# Patient Record
Sex: Female | Born: 1990 | Race: Black or African American | Hispanic: No | Marital: Single | Smoking: Never smoker
Health system: Southern US, Community
[De-identification: ages and names within clinical notes are randomized; demographics above are authoritative.]

## PROBLEM LIST (undated history)

## (undated) ENCOUNTER — Inpatient Hospital Stay (HOSPITAL_COMMUNITY): Payer: Self-pay

## (undated) ENCOUNTER — Inpatient Hospital Stay (HOSPITAL_COMMUNITY): Payer: Medicaid Other

## (undated) DIAGNOSIS — Z789 Other specified health status: Secondary | ICD-10-CM

## (undated) DIAGNOSIS — K5900 Constipation, unspecified: Secondary | ICD-10-CM

## (undated) HISTORY — DX: Constipation, unspecified: K59.00

## (undated) HISTORY — PX: WISDOM TOOTH EXTRACTION: SHX21

---

## 2012-03-24 ENCOUNTER — Ambulatory Visit
Admission: RE | Admit: 2012-03-24 | Discharge: 2012-03-24 | Disposition: A | Discharge status: Home / Self Care | Source: Ambulatory Visit | Attending: Family Medicine | Admitting: Family Medicine

## 2012-03-24 ENCOUNTER — Other Ambulatory Visit: Payer: Self-pay | Admitting: Family Medicine

## 2012-03-24 DIAGNOSIS — R52 Pain, unspecified: Secondary | ICD-10-CM

## 2012-05-25 ENCOUNTER — Other Ambulatory Visit (HOSPITAL_COMMUNITY)
Admission: RE | Admit: 2012-05-25 | Discharge: 2012-05-25 | Disposition: A | Discharge status: Home / Self Care | Source: Ambulatory Visit | Attending: Family Medicine | Admitting: Family Medicine

## 2012-05-25 DIAGNOSIS — Z01419 Encounter for gynecological examination (general) (routine) without abnormal findings: Secondary | ICD-10-CM | POA: Insufficient documentation

## 2013-07-25 ENCOUNTER — Inpatient Hospital Stay (HOSPITAL_COMMUNITY)
Admission: AD | Admit: 2013-07-25 | Discharge: 2013-07-25 | Disposition: A | Discharge status: Home / Self Care | Payer: Medicaid Other | Source: Ambulatory Visit | Attending: Obstetrics & Gynecology | Admitting: Obstetrics & Gynecology

## 2013-07-25 ENCOUNTER — Inpatient Hospital Stay (HOSPITAL_COMMUNITY): Payer: Medicaid Other

## 2013-07-25 ENCOUNTER — Encounter (HOSPITAL_COMMUNITY): Payer: Self-pay | Admitting: *Deleted

## 2013-07-25 DIAGNOSIS — O99891 Other specified diseases and conditions complicating pregnancy: Secondary | ICD-10-CM | POA: Insufficient documentation

## 2013-07-25 DIAGNOSIS — O26899 Other specified pregnancy related conditions, unspecified trimester: Secondary | ICD-10-CM

## 2013-07-25 DIAGNOSIS — Z3490 Encounter for supervision of normal pregnancy, unspecified, unspecified trimester: Secondary | ICD-10-CM

## 2013-07-25 DIAGNOSIS — R109 Unspecified abdominal pain: Secondary | ICD-10-CM | POA: Insufficient documentation

## 2013-07-25 HISTORY — DX: Other specified health status: Z78.9

## 2013-07-25 LAB — WET PREP, GENITAL: Yeast Wet Prep HPF POC: NONE SEEN

## 2013-07-25 LAB — URINE MICROSCOPIC-ADD ON

## 2013-07-25 LAB — CBC
HCT: 37.4 % (ref 36.0–46.0)
Hemoglobin: 13 g/dL (ref 12.0–15.0)
MCHC: 34.8 g/dL (ref 30.0–36.0)
RBC: 4.26 MIL/uL (ref 3.87–5.11)
WBC: 9.2 10*3/uL (ref 4.0–10.5)

## 2013-07-25 LAB — URINALYSIS, ROUTINE W REFLEX MICROSCOPIC
Glucose, UA: NEGATIVE mg/dL
Ketones, ur: NEGATIVE mg/dL
Protein, ur: NEGATIVE mg/dL
Urobilinogen, UA: 0.2 mg/dL (ref 0.0–1.0)

## 2013-07-25 LAB — HCG, QUANTITATIVE, PREGNANCY: hCG, Beta Chain, Quant, S: 21140 m[IU]/mL — ABNORMAL HIGH (ref <5)

## 2013-07-25 LAB — ABO/RH: ABO/RH(D): AB POS

## 2013-07-25 MED ORDER — CONCEPT OB 130-92.4-1 MG PO CAPS: 1.0000 | ORAL_CAPSULE | Freq: Every day | ORAL | Status: DC

## 2013-07-25 NOTE — MAU Provider Note (Signed)
Chief Complaint: Possible Pregnancy and Abdominal Cramping   First Provider Initiated Contact with Patient 07/25/13 2011     SUBJECTIVE HPI: Christine Oliver is a 22 y.o. G1P0 at [redacted]w[redacted]d by LMP who presents with gradually worsening low abd cramping over the past few days. Pos UPT. No Other testing this pregnancy. Denies fever, chills, passage of tissue, vaginal bleeding, vaginal discharge, urinary complaints or GI complaints.  Past Medical History  Diagnosis Date  . Medical history non-contributory    OB History  Gravida Para Term Preterm AB SAB TAB Ectopic Multiple Living  1         0    # Outcome Date GA Lbr Len/2nd Weight Sex Delivery Anes PTL Lv  1 CUR              Past Surgical History  Procedure Laterality Date  . Wisdom tooth extraction     History   Social History  . Marital Status: Single    Spouse Name: N/A    Number of Children: N/A  . Years of Education: N/A   Occupational History  . Not on file.   Social History Main Topics  . Smoking status: Never Smoker   . Smokeless tobacco: Not on file  . Alcohol Use: Yes     Comment: socially  . Drug Use: No  . Sexual Activity: Yes    Birth Control/ Protection: None   Other Topics Concern  . Not on file   Social History Narrative  . No narrative on file   No current facility-administered medications on file prior to encounter.   No current outpatient prescriptions on file prior to encounter.   No Known Allergies  ROS: Pertinent items in HPI  OBJECTIVE Blood pressure 134/87, pulse 93, temperature 99.2 F (37.3 C), temperature source Oral, resp. rate 18, height 5\' 3"  (1.6 m), weight 57.153 kg (126 lb), last menstrual period 06/14/2013, SpO2 100.00%. GENERAL: Well-developed, well-nourished female in no acute distress.  HEENT: Normocephalic HEART: normal rate RESP: normal effort ABDOMEN: Soft, Mild SP tenderness. No CVAT. Pos BS. EXTREMITIES: Nontender, no edema NEURO: Alert and oriented SPECULUM EXAM: NEFG,  physiologic discharge, no blood noted, cervix clean BIMANUAL: cervix closed; uterus ? slightly enlarged, no adnexal tenderness or masses. No CMT.  LAB RESULTS Results for orders placed during the hospital encounter of 07/25/13 (from the past 24 hour(s))  URINALYSIS, ROUTINE W REFLEX MICROSCOPIC     Status: Abnormal   Collection Time    07/25/13  7:14 PM      Result Value Range   Color, Urine YELLOW  YELLOW   APPearance CLEAR  CLEAR   Specific Gravity, Urine 1.020  1.005 - 1.030   pH 7.0  5.0 - 8.0   Glucose, UA NEGATIVE  NEGATIVE mg/dL   Hgb urine dipstick TRACE (*) NEGATIVE   Bilirubin Urine NEGATIVE  NEGATIVE   Ketones, ur NEGATIVE  NEGATIVE mg/dL   Protein, ur NEGATIVE  NEGATIVE mg/dL   Urobilinogen, UA 0.2  0.0 - 1.0 mg/dL   Nitrite NEGATIVE  NEGATIVE   Leukocytes, UA SMALL (*) NEGATIVE  URINE MICROSCOPIC-ADD ON     Status: Abnormal   Collection Time    07/25/13  7:14 PM      Result Value Range   Squamous Epithelial / LPF FEW (*) RARE   WBC, UA 3-6  <3 WBC/hpf   RBC / HPF 0-2  <3 RBC/hpf   Bacteria, UA FEW (*) RARE  POCT PREGNANCY, URINE  Status: Abnormal   Collection Time    07/25/13  7:49 PM      Result Value Range   Preg Test, Ur POSITIVE (*) NEGATIVE  HCG, QUANTITATIVE, PREGNANCY     Status: Abnormal   Collection Time    07/25/13  8:40 PM      Result Value Range   hCG, Beta Chain, Sharene Butters, Kathie Rhodes [id number] (*) <5 mIU/mL  ABO/RH     Status: None   Collection Time    07/25/13  8:40 PM      Result Value Range   ABO/RH(D) AB POS    CBC     Status: None   Collection Time    07/25/13  8:40 PM      Result Value Range   WBC 9.2  4.0 - 10.5 K/uL   RBC 4.26  3.87 - 5.11 MIL/uL   Hemoglobin 13.0  12.0 - 15.0 g/dL   HCT 78.2  95.6 - 21.3 %   MCV 87.8  78.0 - 100.0 fL   MCH 30.5  26.0 - 34.0 pg   MCHC 34.8  30.0 - 36.0 g/dL   RDW 08.6  57.8 - 46.9 %   Platelets 274  150 - 400 K/uL    IMAGING US Ob Comp Less 14 Wks  07/25/2013   CLINICAL DATA:  Abdominal cramping.  Positive home pregnancy test.  EXAM: OBSTETRIC <14 WK ULTRASOUND  TECHNIQUE: Transabdominal ultrasound was performed for evaluation of the gestation as well as the maternal uterus and adnexal regions.  COMPARISON:  None.  FINDINGS: Intrauterine gestational sac: Visualized/normal in shape.  Yolk sac:  Present  Embryo:  Present  Cardiac Activity: Present  Heart Rate: 115 bpm  MSD: 3.8 mm    6 w   1  d  [place] EDC: 03/19/2014  Maternal uterus/adnexae: Small subchorionic hemorrhage. Normal ovaries. No free fluid.  IMPRESSION: Single viable intrauterine 6 weeks 1 day embryo as described above. [facility] 03/19/2014.   Electronically Signed   By: Davonna Belling M.D.   On: 07/25/2013 21:49   MAU COURSE  ASSESSMENT 1. Abdominal pain complicating pregnancy   2. Intrauterine pregnancy     PLAN Discharge home in stable condition.  GC/CT pending.     Follow-up Information   Follow up with [facility].      Follow up with THE [facility] [place] MATERNITY ADMISSIONS. (As needed in emergencies)    Contact information:   [id number] [place] [address] [phone]       Medication List         CONCEPT OB 130-92.4-1 MG Caps  Take 1 tablet by mouth daily.       [place], CNM 07/25/2013  10:52 PM

## 2013-07-25 NOTE — MAU Note (Signed)
Pt reports she had a positive home pregnancy test, LMP 06/14/2013. Lower abd cramping

## 2013-07-26 LAB — URINE CULTURE

## 2013-07-26 LAB — GC/CHLAMYDIA PROBE AMP
CT Probe RNA: NEGATIVE
GC Probe RNA: NEGATIVE

## 2013-08-29 ENCOUNTER — Encounter (HOSPITAL_COMMUNITY): Payer: Self-pay | Admitting: *Deleted

## 2013-08-29 ENCOUNTER — Inpatient Hospital Stay (HOSPITAL_COMMUNITY)
Admission: AD | Admit: 2013-08-29 | Discharge: 2013-08-29 | Disposition: A | Discharge status: Home / Self Care | Payer: Medicaid Other | Source: Ambulatory Visit | Attending: Obstetrics & Gynecology | Admitting: Obstetrics & Gynecology

## 2013-08-29 ENCOUNTER — Inpatient Hospital Stay (HOSPITAL_COMMUNITY): Payer: Medicaid Other

## 2013-08-29 DIAGNOSIS — K5900 Constipation, unspecified: Secondary | ICD-10-CM | POA: Insufficient documentation

## 2013-08-29 DIAGNOSIS — O021 Missed abortion: Secondary | ICD-10-CM

## 2013-08-29 DIAGNOSIS — R109 Unspecified abdominal pain: Secondary | ICD-10-CM | POA: Insufficient documentation

## 2013-08-29 LAB — CBC
HCT: 37.4 % (ref 36.0–46.0)
Hemoglobin: 12.6 g/dL (ref 12.0–15.0)
MCH: 29.7 pg (ref 26.0–34.0)
MCHC: 33.7 g/dL (ref 30.0–36.0)
MCV: 88.2 fL (ref 78.0–100.0)
Platelets: 274 10*3/uL (ref 150–400)
RBC: 4.24 MIL/uL (ref 3.87–5.11)
WBC: 7.6 10*3/uL (ref 4.0–10.5)

## 2013-08-29 LAB — URINALYSIS, ROUTINE W REFLEX MICROSCOPIC
Bilirubin Urine: NEGATIVE
Glucose, UA: NEGATIVE mg/dL
Hgb urine dipstick: NEGATIVE
Ketones, ur: NEGATIVE mg/dL
Nitrite: NEGATIVE
Specific Gravity, Urine: 1.02 (ref 1.005–1.030)
Urobilinogen, UA: 0.2 mg/dL (ref 0.0–1.0)

## 2013-08-29 MED ORDER — MAGNESIUM CITRATE PO SOLN
0.5000 | Freq: Once | ORAL | Status: AC
  Administered 2013-08-29: 0.5 via ORAL
  Filled 2013-08-29: qty 296

## 2013-08-29 MED ORDER — MISOPROSTOL 200 MCG PO TABS: 800.0000 ug | ORAL_TABLET | Freq: Four times a day (QID) | ORAL | Status: DC

## 2013-08-29 MED ORDER — OXYCODONE-ACETAMINOPHEN 5-325 MG PO TABS: 1.0000 | ORAL_TABLET | ORAL | Status: DC | PRN

## 2013-08-29 MED ORDER — PROMETHAZINE HCL 25 MG PO TABS: 25.0000 mg | ORAL_TABLET | Freq: Four times a day (QID) | ORAL | Status: DC | PRN

## 2013-08-29 NOTE — MAU Provider Note (Signed)
History     CSN: [id number]  Arrival date and time: 08/29/13 1807   None     Chief Complaint  Patient presents with  . Constipation  . Vaginal Discharge  . Morning Sickness  . Abdominal Pain   HPI This is a 22 y.o. female at [redacted]w[redacted]d who presents with c/o spotting and cramping.  Also has been constipated. This is a lifelong problem for her,but her usual remedies are not working. Had a viable IUP in October.   RN Note:  Patient states she has continued to have cramping since she was seen in MAU in October. Has not had a good bowel movement in about one month. Has tried OTC medication and prune juice with little to no results. States she has nausea but has gotten better the past week. Has a little spotting on and off, not enough to wear a pad. Back pain has been for about 2 weeks.        OB History   Grav Para Term Preterm Abortions TAB SAB Ect Mult Living   1         0      Past Medical History  Diagnosis Date  . Medical history non-contributory     Past Surgical History  Procedure Laterality Date  . Wisdom tooth extraction      History reviewed. No pertinent family history.  History  Substance Use Topics  . Smoking status: Never Smoker   . Smokeless tobacco: Not on file  . Alcohol Use: Yes     Comment: socially    Allergies: No Known Allergies  Prescriptions prior to admission  Medication Sig Dispense Refill  . Prenat w/o A Vit-FeFum-FePo-FA (CONCEPT OB) 130-92.4-1 MG CAPS Take 1 tablet by mouth daily.  30 capsule  12    Review of Systems  Constitutional: Negative for fever, chills and malaise/fatigue.  Gastrointestinal: Positive for abdominal pain and constipation. Negative for nausea, vomiting and diarrhea.       Vaginal bleeding  Genitourinary: Negative for dysuria.  Neurological: Negative for dizziness, weakness and headaches.   Physical Exam   Blood pressure 130/82, pulse 93, temperature 99.7 F (37.6 C), temperature source Oral, resp. rate 16,  height 5\' 3"  (1.6 m), weight 57.97 kg (127 lb 12.8 oz), last menstrual period 06/14/2013, SpO2 100.00%.  Physical Exam  Constitutional: She is oriented to person, place, and time. She appears well-developed and well-nourished. No distress.  Cardiovascular: Normal rate.   Respiratory: Effort normal.  GI: Soft. She exhibits no distension and no mass. There is no tenderness. There is no rebound and no guarding.  Genitourinary: Uterus normal. Vaginal discharge (small amount of bleeding) found.  Musculoskeletal: Normal range of motion.  Neurological: She is alert and oriented to person, place, and time.  Skin: Skin is warm and dry.  Psychiatric: She has a normal mood and affect.   Results for orders placed during the hospital encounter of 08/29/13 (from the past 24 hour(s))  URINALYSIS, ROUTINE W REFLEX MICROSCOPIC     Status: Abnormal   Collection Time    08/29/13  6:39 PM      Result Value Range   Color, Urine YELLOW  YELLOW   APPearance CLOUDY (*) CLEAR   Specific Gravity, Urine 1.020  1.005 - 1.030   pH 8.0  5.0 - 8.0   Glucose, UA NEGATIVE  NEGATIVE mg/dL   Hgb urine dipstick NEGATIVE  NEGATIVE   Bilirubin Urine NEGATIVE  NEGATIVE   Ketones, ur NEGATIVE  NEGATIVE  mg/dL   Protein, ur NEGATIVE  NEGATIVE mg/dL   Urobilinogen, UA 0.2  0.0 - 1.0 mg/dL   Nitrite NEGATIVE  NEGATIVE   Leukocytes, UA NEGATIVE  NEGATIVE  CBC     Status: None   Collection Time    08/29/13  8:44 PM      Result Value Range   WBC 7.6  4.0 - 10.5 K/uL   RBC 4.24  3.87 - 5.11 MIL/uL   Hemoglobin 12.6  12.0 - 15.0 g/dL   HCT 16.1  09.6 - 04.5 %   MCV 88.2  78.0 - 100.0 fL   MCH 29.7  26.0 - 34.0 pg   MCHC 33.7  30.0 - 36.0 g/dL   RDW 40.9  81.1 - 91.4 %   Platelets 274  150 - 400 K/uL    MAU Course  Procedures  MDM US Ob Transvaginal  08/29/2013   CLINICAL DATA:  Positive pregnancy test and vaginal bleeding.  EXAM: TRANSVAGINAL OB ULTRASOUND  TECHNIQUE: Transvaginal ultrasound was performed for  complete evaluation of the gestation as well as the maternal uterus, adnexal regions, and pelvic cul-de-sac.  COMPARISON:  07/25/2013  FINDINGS: Intrauterine gestational sac: Single, irregular gestational sac is identified.  Yolk sac:  Not visualized  Embryo:  Visualize  Cardiac Activity: Not visualized  CRL:   19.5  mm   8 w 4 d  The uterus appears thickened and heterogeneous. There may be a trace amount of subchorionic hemorrhage. Neither maternal ovary can be visualized. No free fluid is identified in the cul-de-sac  IMPRESSION: Single intrauterine gestational sac with embryo visualized. There is no detectable cardiac activity within the embryo on 2D grayscale or M-mode Doppler evaluation. Crown-rump length suggests lack of appropriate interval progression since previous ultrasound exam. Together, these imaging features are consistent with intrauterine gestational demise.  I discussed these findings by telephone with the provider in the MAU Mayford Knife) at 2024 hr on 08/29/2013.   Electronically Signed   By: Kennith Center M.D.   On: 08/29/2013 20:25    Assessment and Plan  A:  Missed abortion      Constipation  P:  Discussed with patient and partner, appropriately grieving      Options reviewed, chose Cytotec      Rx Cytotec, Phenergan and Percocet      Magnesium citrate      Followup in clinic  [facility] 08/29/2013, 7:01 PM

## 2013-08-29 NOTE — MAU Note (Signed)
Patient states she has continued to have cramping since she was seen in MAU in October. Has not had a good bowel movement in about one month. Has tried OTC medication and prune juice with little to no results. States she has nausea but has gotten better the past week. Has a little spotting on and off, not enough to wear a pad. Back pain has been for about 2 weeks.

## 2013-10-01 ENCOUNTER — Encounter: Payer: Self-pay | Admitting: Family Medicine

## 2013-10-01 ENCOUNTER — Ambulatory Visit (INDEPENDENT_AMBULATORY_CARE_PROVIDER_SITE_OTHER): Admitting: Family Medicine

## 2013-10-01 VITALS — BP 134/86 | HR 82 | Ht 63.0 in | Wt 129.4 lb

## 2013-10-01 DIAGNOSIS — Z3049 Encounter for surveillance of other contraceptives: Secondary | ICD-10-CM

## 2013-10-01 DIAGNOSIS — Z30430 Encounter for insertion of intrauterine contraceptive device: Secondary | ICD-10-CM

## 2013-10-01 DIAGNOSIS — O039 Complete or unspecified spontaneous abortion without complication: Secondary | ICD-10-CM

## 2013-10-01 MED ORDER — LEVONORGESTREL 20 MCG/24HR IU IUD
INTRAUTERINE_SYSTEM | Freq: Once | INTRAUTERINE | Status: AC
  Administered 2013-10-01: 17:00:00 1 via INTRAUTERINE

## 2013-10-01 NOTE — Patient Instructions (Addendum)
Levonorgestrel intrauterine device (IUD) What is this medicine? LEVONORGESTREL IUD ([name] voe nor jes trel) is a contraceptive (birth control) device. The device is placed inside the uterus by a healthcare professional. It is used to prevent pregnancy and can also be used to treat heavy bleeding that occurs during your period. Depending on the device, it can be used for 3 to 5 years. This medicine may be used for other purposes; ask your health care provider or pharmacist if you have questions. COMMON BRAND NAME(S): Mirena, Skyla What should I tell my health care provider before I take this medicine? They need to know if you have any of these conditions: -abnormal Pap smear -cancer of the breast, uterus, or cervix -diabetes -endometritis -genital or pelvic infection now or in the past -have more than one sexual partner or your partner has more than one partner -heart disease -history of an ectopic or tubal pregnancy -immune system problems -IUD in place -liver disease or tumor -problems with blood clots or take blood-thinners -use intravenous drugs -uterus of unusual shape -vaginal bleeding that has not been explained -an unusual or allergic reaction to levonorgestrel, other hormones, silicone, or polyethylene, medicines, foods, dyes, or preservatives -pregnant or trying to get pregnant -breast-feeding How should I use this medicine? This device is placed inside the uterus by a health care professional. Talk to your pediatrician regarding the use of this medicine in children. Special care may be needed. Overdosage: If you think you have taken too much of this medicine contact a poison control center or emergency room at once. NOTE: This medicine is only for you. Do not share this medicine with others. What if I miss a dose? This does not apply. What may interact with this medicine? Do not take this medicine with any of the following  medications: -amprenavir -bosentan -fosamprenavir This medicine may also interact with the following medications: -aprepitant -barbiturate medicines for inducing sleep or treating seizures -bexarotene -griseofulvin -medicines to treat seizures like carbamazepine, ethotoin, felbamate, oxcarbazepine, phenytoin, topiramate -modafinil -pioglitazone -rifabutin -rifampin -rifapentine -some medicines to treat HIV infection like atazanavir, indinavir, lopinavir, nelfinavir, tipranavir, ritonavir -St. John's wort -warfarin This list may not describe all possible interactions. Give your health care provider a list of all the medicines, herbs, non-prescription drugs, or dietary supplements you use. Also tell them if you smoke, drink alcohol, or use illegal drugs. Some items may interact with your medicine. What should I watch for while using this medicine? Visit your doctor or health care professional for regular check ups. See your doctor if you or your partner has sexual contact with others, becomes HIV positive, or gets a sexual transmitted disease. This product does not protect you against HIV infection (AIDS) or other sexually transmitted diseases. You can check the placement of the IUD yourself by reaching up to the top of your vagina with clean fingers to feel the threads. Do not pull on the threads. It is a good habit to check placement after each menstrual period. Call your doctor right away if you feel more of the IUD than just the threads or if you cannot feel the threads at all. The IUD may come out by itself. You may become pregnant if the device comes out. If you notice that the IUD has come out use a backup birth control method like condoms and call your health care provider. Using tampons will not change the position of the IUD and are okay to use during your period. What side effects may I   notice from receiving this medicine? Side effects that you should report to your doctor or  health care professional as soon as possible: -allergic reactions like skin rash, itching or hives, swelling of the face, lips, or tongue -fever, flu-like symptoms -genital sores -high blood pressure -no menstrual period for 6 weeks during use -pain, swelling, warmth in the leg -pelvic pain or tenderness -severe or sudden headache -signs of pregnancy -stomach cramping -sudden shortness of breath -trouble with balance, talking, or walking -unusual vaginal bleeding, discharge -yellowing of the eyes or skin Side effects that usually do not require medical attention (report to your doctor or health care professional if they continue or are bothersome): -acne -breast pain -change in sex drive or performance -changes in weight -cramping, dizziness, or faintness while the device is being inserted -headache -irregular menstrual bleeding within first 3 to 6 months of use -nausea This list may not describe all possible side effects. Call your doctor for medical advice about side effects. You may report side effects to FDA at 1-800-FDA-1088. Where should I keep my medicine? This does not apply. NOTE: This sheet is a summary. It may not cover all possible information. If you have questions about this medicine, talk to your doctor, pharmacist, or health care provider.  2014, Elsevier/Gold Standard. (2011-10-28 13:54:04)  

## 2013-10-01 NOTE — Progress Notes (Signed)
   Subjective:    Patient ID: Christine Oliver, female    DOB: 04/25/1991, 22 y.o.   MRN: [id number]  HPI  New pt. For ED f/u after SAB treated with Cytotec. Bled x 2 wks. Recent cycle starting today. Works as a Museum/gallery conservator.  Does not desire more pregnancy at this time.  Previously on NuvaRing, wants more long-term contraception.  Failed Depo with bleeding.  Past Medical History  Diagnosis Date  . Medical history non-contributory    Past Surgical History  Procedure Laterality Date  . Wisdom tooth extraction     No Known Allergies  No current outpatient prescriptions on file prior to visit.   No current facility-administered medications on file prior to visit.   History reviewed. No pertinent family history. History   Social History  . Marital Status: Single    Spouse Name: N/A    Number of Children: N/A  . Years of Education: N/A   Occupational History  . Not on file.   Social History Main Topics  . Smoking status: Never Smoker   . Smokeless tobacco: Not on file  . Alcohol Use: Yes     Comment: socially  . Drug Use: No  . Sexual Activity: Yes    Birth Control/ Protection: None   Other Topics Concern  . Not on file   Social History Narrative  . No narrative on file    Review of Systems  Constitutional: Negative for fever and fatigue.  Respiratory: Negative for shortness of breath.   Cardiovascular: Negative for leg swelling.  Gastrointestinal: Negative for abdominal pain.  Genitourinary: Negative for dysuria and urgency.       Objective:   Physical Exam  Vitals reviewed. Constitutional: She appears well-developed and well-nourished. No distress.  HENT:  Head: Normocephalic and atraumatic.  Eyes: No scleral icterus.  Neck: Neck supple.  Cardiovascular: Normal rate.   Pulmonary/Chest: Effort normal.  Abdominal: Soft. There is no tenderness.  Genitourinary: Vagina normal and uterus normal.  Musculoskeletal: Normal range of motion.  Neurological: She is  alert.  Skin: Skin is warm and dry.    Procedure: Patient identified, informed consent performed, signed copy in chart, time out was performed.  Urine pregnancy test negative.  Speculum placed in the vagina.  Cervix visualized.  Cleaned with Betadine x 2.  Grasped anteriourly with a single tooth tenaculum.  Uterus sounded to 7 cm.  Mirena IUD placed per manufacturer's recommendations.  Strings trimmed to 3 cm.   Patient given post procedure instructions and Mirena care card with expiration date.        Assessment & Plan:  Surveillance of other previously prescribed contraceptive method - Plan: levonorgestrel (MIRENA) 20 MCG/24HR IUD  Encounter for IUD insertion  S/p IUD insertion. F/u SAB-complete--no issues.

## 2013-10-11 NOTE — L&D Delivery Note (Addendum)
Patient is 23 y.o. [id number] [id number] admitted for IOL 2/2 SGA with elevated dopplers, induction with cytotec x 2 and FB, GBS+   Delivery Note At 8:54 PM a viable female was delivered via Vaginal, Spontaneous Delivery (Presentation: ; Occiput Anterior).  APGAR: 8, 9; weight 4 lb 9.4 oz (2081 g).   Placenta status: Intact, Spontaneous.  Cord: 3 vessels with the following complications: None.  Anesthesia: Epidural  Episiotomy: None Lacerations: 1st degree Suture Repair: 3.0 vicryl Est. Blood Loss (mL): 200  Mom to postpartum.  Baby to Couplet care / Skin to Skin.  Christine Oliver 09/27/2014, 9:54 PM

## 2013-10-15 ENCOUNTER — Encounter: Admitting: Obstetrics & Gynecology

## 2013-10-17 ENCOUNTER — Encounter: Payer: Self-pay | Admitting: *Deleted

## 2014-06-15 ENCOUNTER — Inpatient Hospital Stay (HOSPITAL_COMMUNITY)
Admission: AD | Admit: 2014-06-15 | Discharge: 2014-06-15 | Disposition: A | Discharge status: Home / Self Care | Payer: Medicaid Other | Source: Ambulatory Visit | Attending: Family Medicine | Admitting: Family Medicine

## 2014-06-15 ENCOUNTER — Encounter (HOSPITAL_COMMUNITY): Payer: Self-pay

## 2014-06-15 ENCOUNTER — Inpatient Hospital Stay (HOSPITAL_COMMUNITY): Payer: Medicaid Other

## 2014-06-15 DIAGNOSIS — O0930 Supervision of pregnancy with insufficient antenatal care, unspecified trimester: Secondary | ICD-10-CM | POA: Diagnosis not present

## 2014-06-15 DIAGNOSIS — O99891 Other specified diseases and conditions complicating pregnancy: Secondary | ICD-10-CM | POA: Insufficient documentation

## 2014-06-15 DIAGNOSIS — Z3490 Encounter for supervision of normal pregnancy, unspecified, unspecified trimester: Secondary | ICD-10-CM

## 2014-06-15 DIAGNOSIS — Z3200 Encounter for pregnancy test, result unknown: Secondary | ICD-10-CM | POA: Diagnosis present

## 2014-06-15 DIAGNOSIS — Z331 Pregnant state, incidental: Secondary | ICD-10-CM

## 2014-06-15 DIAGNOSIS — Z3689 Encounter for other specified antenatal screening: Secondary | ICD-10-CM

## 2014-06-15 DIAGNOSIS — O0932 Supervision of pregnancy with insufficient antenatal care, second trimester: Secondary | ICD-10-CM

## 2014-06-15 DIAGNOSIS — O9989 Other specified diseases and conditions complicating pregnancy, childbirth and the puerperium: Principal | ICD-10-CM

## 2014-06-15 LAB — POCT PREGNANCY, URINE: PREG TEST UR: POSITIVE — AB

## 2014-06-15 NOTE — MAU Provider Note (Signed)
Attestation of Attending Supervision of Advanced Practitioner (PA/CNM/NP): Evaluation and management procedures were performed by the Advanced Practitioner under my supervision and collaboration.  I have reviewed the Advanced Practitioner's note and chart, and I agree with the management and plan. Pt. Had U/s at 5 wks, which did not visualize an IUD at that time.  Reva Bores, MD [facility] Attending 06/15/2014 8:58 PM

## 2014-06-15 NOTE — MAU Provider Note (Signed)
  History     CSN: [id number]  Arrival date and time: 06/15/14 1643   First Provider Initiated Contact with Patient 06/15/14 1722      Chief Complaint  Patient presents with  . Positive pregnancy test, has IUD    HPI Ms. Christine Oliver is a 23 y.o. G1P0010 at Unknown who presents to MAU today with complaint of +HPT and Mirena. The patient states that she had Mirena placed in December. She has not had a period or any bleeding since then. She took 2 HPT and both were positive. She denies abdominal pain, vaginal bleeding, discharge or fever. She states occasional nausea without vomiting.   OB History   Grav Para Term Preterm Abortions TAB SAB Ect Mult Living   0      Past Medical History  Diagnosis Date  . Medical history non-contributory     Past Surgical History  Procedure Laterality Date  . Wisdom tooth extraction      History reviewed. No pertinent family history.  History  Substance Use Topics  . Smoking status: Never Smoker   . Smokeless tobacco: Not on file  . Alcohol Use: Yes     Comment: socially    Allergies: No Known Allergies  No prescriptions prior to admission    Review of Systems  Constitutional: Negative for fever and malaise/fatigue.  Gastrointestinal: Positive for nausea. Negative for vomiting and abdominal pain.  Genitourinary:       Neg - vaginal bleeding   Physical Exam   Blood pressure 146/80, pulse 112, temperature 99.1 F (37.3 C), temperature source Oral, resp. rate 18, height  (1.6 m), last menstrual period 06/14/2013, not currently breastfeeding.  Physical Exam  Constitutional: She is oriented to person, place, and time. She appears well-developed and well-nourished. No distress.  HENT:  Head: Normocephalic.  Cardiovascular: Normal rate.   Respiratory: Effort normal.  GI: Soft. She exhibits no distension and no mass. There is no tenderness. There is no rebound and no guarding.  Fundal height palpable just below  the umbilicus  Genitourinary: Uterus is enlarged. Cervix exhibits no discharge and no friability. No bleeding around the vagina. No vaginal discharge found.    Neurological: She is alert and oriented to person, place, and time.  Skin: Skin is warm and dry. No erythema.  Psychiatric: She has a normal mood and affect.   Results for orders placed during the hospital encounter of 06/15/14 (from the past 24 hour(s))  POCT PREGNANCY, URINE     Status: Abnormal   Collection Time    06/15/14  5:52 PM      Result Value Ref Range   Preg Test, Ur POSITIVE (*) NEGATIVE    MAU Course  Procedures None  MDM FHR - 166 bpm with doppler Unable to visualize IUD strings. [place] ordered to confirm placement and dating.  Preliminary [place] reports showed IUP at [redacted]w[redacted]d with normal cardiac activity. No IUD visualized.  Assessment and Plan  A: SIUP at [redacted]w[redacted]d No prenatal care  P: Discharge home Patient referred to [facility] for prenatal care. They will call her with an appointment Second trimester warning signs discussed Patient advised to start prenatal vitamins Patient may return to MAU as needed or if her condition were to change or worsen  Marny Lowenstein, PA-C  06/15/2014, 6:42 PM

## 2014-06-15 NOTE — MAU Note (Signed)
Pt states took upt because she was having heartburn and nausea. Thought she was feeling movement, unsure if it was gas. Took upt which was positive. +FHT's in triage.

## 2014-06-15 NOTE — Discharge Instructions (Signed)
Second Trimester of Pregnancy The second trimester is from week 13 through week 28, month 4 through 6. This is often the time in pregnancy that you feel your best. Often times, morning sickness has lessened or quit. You may have more energy, and you may get hungry more often. Your unborn baby (fetus) is growing rapidly. At the end of the sixth month, he or she is about 9 inches long and weighs about 1 pounds. You will likely feel the baby move (quickening) between 18 and 20 weeks of pregnancy. HOME CARE   Avoid all smoking, herbs, and alcohol. Avoid drugs not approved by your doctor.  Only take medicine as told by your doctor. Some medicines are safe and some are not during pregnancy.  Exercise only as told by your doctor. Stop exercising if you start having cramps.  Eat regular, healthy meals.  Wear a good support bra if your breasts are tender.  Do not use hot tubs, steam rooms, or saunas.  Wear your seat belt when driving.  Avoid raw meat, uncooked cheese, and liter boxes and soil used by cats.  Take your prenatal vitamins.  Try taking medicine that helps you poop (stool softener) as needed, and if your doctor approves. Eat more fiber by eating fresh fruit, vegetables, and whole grains. Drink enough fluids to keep your pee (urine) clear or pale yellow.  Take warm water baths (sitz baths) to soothe pain or discomfort caused by hemorrhoids. Use hemorrhoid cream if your doctor approves.  If you have puffy, bulging veins (varicose veins), wear support hose. Raise (elevate) your feet for 15 minutes, 3-4 times a day. Limit salt in your diet.  Avoid heavy lifting, wear low heals, and sit up straight.  Rest with your legs raised if you have leg cramps or low back pain.  Visit your dentist if you have not gone during your pregnancy. Use a soft toothbrush to brush your teeth. Be gentle when you floss.  You can have sex (intercourse) unless your doctor tells you not to.  Go to your  doctor visits. GET HELP IF:   You feel dizzy.  You have mild cramps or pressure in your lower belly (abdomen).  You have a nagging pain in your belly area.  You continue to feel sick to your stomach (nauseous), throw up (vomit), or have watery poop (diarrhea).  You have bad smelling fluid coming from your vagina.  You have pain with peeing (urination). GET HELP RIGHT AWAY IF:   You have a fever.  You are leaking fluid from your vagina.  You have spotting or bleeding from your vagina.  You have severe belly cramping or pain.  You lose or gain weight rapidly.  You have trouble catching your breath and have chest pain.  You notice sudden or extreme puffiness (swelling) of your face, hands, ankles, feet, or legs.  You have not felt the baby move in over an hour.  You have severe headaches that do not go away with medicine.  You have vision changes. Document Released: 12/22/2009 Document Revised: 01/22/2013 Document Reviewed: 11/28/2012 [facility] Patient Information 2015 [place], [place]. This information is not intended to replace advice given to you by your health care provider. Make sure you discuss any questions you have with your health care provider.  For assistance with applying for medicaid please call Fausto Skillern [phone] or Anne Hahn [phone].

## 2014-07-03 ENCOUNTER — Ambulatory Visit (INDEPENDENT_AMBULATORY_CARE_PROVIDER_SITE_OTHER): Payer: Medicaid Other | Admitting: Family

## 2014-07-03 ENCOUNTER — Encounter: Payer: Self-pay | Admitting: Family

## 2014-07-03 VITALS — BP 120/71 | HR 95 | Temp 98.1°F | Wt 130.8 lb

## 2014-07-03 DIAGNOSIS — Z3480 Encounter for supervision of other normal pregnancy, unspecified trimester: Secondary | ICD-10-CM

## 2014-07-03 DIAGNOSIS — O0930 Supervision of pregnancy with insufficient antenatal care, unspecified trimester: Secondary | ICD-10-CM

## 2014-07-03 DIAGNOSIS — Z3492 Encounter for supervision of normal pregnancy, unspecified, second trimester: Secondary | ICD-10-CM

## 2014-07-03 DIAGNOSIS — O0932 Supervision of pregnancy with insufficient antenatal care, second trimester: Secondary | ICD-10-CM | POA: Insufficient documentation

## 2014-07-03 LAB — POCT URINALYSIS DIP (DEVICE)
Bilirubin Urine: NEGATIVE
Glucose, UA: NEGATIVE mg/dL
Hgb urine dipstick: NEGATIVE
Ketones, ur: NEGATIVE mg/dL
Nitrite: NEGATIVE
Protein, ur: NEGATIVE mg/dL
Specific Gravity, Urine: 1.025 (ref 1.005–1.030)
UROBILINOGEN UA: 1 mg/dL (ref 0.0–1.0)
pH: 5.5 (ref 5.0–8.0)

## 2014-07-03 NOTE — Progress Notes (Signed)
   Subjective:    Christine Oliver is a G2P0010 [redacted]w[redacted]d being seen today for her first obstetrical visit.  Her obstetrical history is significant for constipation and late prenatal care.. Patient does intend to breast feed. Pregnancy history fully reviewed.  Patient reports no bleeding, no cramping and chronic constipation for which she takes Miralax.  Pt has seen a gastroenterologist for this in the past, recommended taking Miralax.    Filed Vitals:   07/03/14 1016  BP: 120/71  Pulse: 95  Temp: 98.1 F (36.7 C)  Weight: 130 lb 12.8 oz (59.33 kg)    HISTORY: OB History  Gravida Para Term Preterm AB SAB TAB Ectopic Multiple Living  0    # Outcome Date GA Lbr Len/2nd Weight Sex Delivery Anes PTL Lv  2 CUR           1 SAB 08/2013 [redacted]w[redacted]d            Comments: System Generated. Please review and update pregnancy details.  per pateint was told baby stopped growing at 8 wk      Past Medical History  Diagnosis Date  . Medical history non-contributory   . Constipation    Past Surgical History  Procedure Laterality Date  . Wisdom tooth extraction     Family History  Problem Relation Age of Onset  .       Exam   BP 120/71  Pulse 95  Temp(Src) 98.1 F (36.7 C)  Wt 130 lb 12.8 oz (59.33 kg)  LMP 06/14/2013 Uterine Size: size equals dates  Pelvic Exam:    Perineum: No Hemorrhoids, Normal Perineum   Vulva: normal   Vagina:  normal mucosa, normal discharge, no palpable nodules   pH: Not done   Cervix: Mod cervical bleeding following Pap, applied pressure with resolution.  Multiple nabothian cyst.  No cervical motion tenderness.   Adnexa: normal adnexa and no mass, fullness, tenderness   Bony Pelvis: Adequate  System: Breast:  No nipple retraction or dimpling, No nipple discharge or bleeding, No axillary or supraclavicular adenopathy, Normal to palpation without dominant masses   Skin: normal coloration and turgor, no rashes.  Multiple tattoos.      Neurologic:  negative   Extremities: normal strength, tone, and muscle mass   HEENT neck supple with midline trachea and thyroid without masses   Mouth/Teeth mucous membranes moist, pharynx normal without lesions   Neck supple and no masses   Cardiovascular: regular rate and rhythm, no murmurs or gallops   Respiratory:  appears well, vitals normal, no respiratory distress, acyanotic, normal RR, neck free of mass or lymphadenopathy, chest clear, no wheezing, crepitations, rhonchi, normal symmetric air entry   Abdomen: soft, non-tender; bowel sounds normal; no masses,  no organomegaly   Urinary: urethral meatus normal     Assessment:    Pregnancy:  23 yo G2P0010 at [redacted]w[redacted]d wks IUP Chronic Constipation  Patient Active Problem List   Diagnosis Date Noted  . Late prenatal care affecting pregnancy in second trimester, antepartum 07/03/2014        Plan:     Initial labs drawn. Prenatal vitamins. Problem list reviewed and updated. Genetic Screening discussed:  Too late  Ultrasound discussed; fetal survey: ordered.  Follow up in 3 weeks.    Marlis Edelson 07/03/2014

## 2014-07-03 NOTE — Progress Notes (Signed)
Here for first visit. States had an IUD inserted 09/2013- but got pregnant and at [facility] visit had ultrasound that did not show IUD. Given pregnancy information packet. Discussed bmi/ appropriate weight gain.

## 2014-07-04 ENCOUNTER — Ambulatory Visit (HOSPITAL_COMMUNITY)
Admission: RE | Admit: 2014-07-04 | Discharge: 2014-07-04 | Disposition: A | Discharge status: Home / Self Care | Payer: Medicaid Other | Source: Ambulatory Visit | Attending: Family | Admitting: Family

## 2014-07-04 ENCOUNTER — Other Ambulatory Visit: Payer: Self-pay | Admitting: Family

## 2014-07-04 ENCOUNTER — Encounter: Payer: Self-pay | Admitting: Family

## 2014-07-04 DIAGNOSIS — Z1389 Encounter for screening for other disorder: Secondary | ICD-10-CM | POA: Insufficient documentation

## 2014-07-04 DIAGNOSIS — O0930 Supervision of pregnancy with insufficient antenatal care, unspecified trimester: Secondary | ICD-10-CM | POA: Insufficient documentation

## 2014-07-04 DIAGNOSIS — Z3492 Encounter for supervision of normal pregnancy, unspecified, second trimester: Secondary | ICD-10-CM

## 2014-07-04 DIAGNOSIS — Z363 Encounter for antenatal screening for malformations: Secondary | ICD-10-CM | POA: Insufficient documentation

## 2014-07-04 DIAGNOSIS — O0932 Supervision of pregnancy with insufficient antenatal care, second trimester: Secondary | ICD-10-CM

## 2014-07-04 LAB — OBSTETRIC PANEL
Antibody Screen: NEGATIVE
BASOS ABS: 0 10*3/uL (ref 0.0–0.1)
Basophils Relative: 0 % (ref 0–1)
Eosinophils Absolute: 0.1 10*3/uL (ref 0.0–0.7)
Eosinophils Relative: 1 % (ref 0–5)
HCT: 35.9 % — ABNORMAL LOW (ref 36.0–46.0)
HEP B S AG: NEGATIVE
Hemoglobin: 12.3 g/dL (ref 12.0–15.0)
LYMPHS ABS: 1.4 10*3/uL (ref 0.7–4.0)
Lymphocytes Relative: 12 % (ref 12–46)
MCH: 31.6 pg (ref 26.0–34.0)
MCHC: 34.3 g/dL (ref 30.0–36.0)
MCV: 92.3 fL (ref 78.0–100.0)
Monocytes Absolute: 0.5 10*3/uL (ref 0.1–1.0)
Monocytes Relative: 4 % (ref 3–12)
NEUTROS ABS: 9.5 10*3/uL — AB (ref 1.7–7.7)
Neutrophils Relative %: 83 % — ABNORMAL HIGH (ref 43–77)
PLATELETS: 268 10*3/uL (ref 150–400)
RBC: 3.89 MIL/uL (ref 3.87–5.11)
RDW: 13.3 % (ref 11.5–15.5)
RUBELLA: 1.99 {index} — AB (ref <0.90)
Rh Type: POSITIVE
WBC: 11.5 10*3/uL — AB (ref 4.0–10.5)

## 2014-07-04 LAB — CULTURE, OB URINE
Colony Count: NO GROWTH
ORGANISM ID, BACTERIA: NO GROWTH

## 2014-07-04 LAB — HIV ANTIBODY (ROUTINE TESTING W REFLEX): HIV 1&2 Ab, 4th Generation: NONREACTIVE

## 2014-07-05 LAB — PRESCRIPTION MONITORING PROFILE (19 PANEL)
Amphetamine/Meth: NEGATIVE ng/mL
Barbiturate Screen, Urine: NEGATIVE ng/mL
Benzodiazepine Screen, Urine: NEGATIVE ng/mL
Buprenorphine, Urine: NEGATIVE ng/mL
CANNABINOID SCRN UR: NEGATIVE ng/mL
CARISOPRODOL, URINE: NEGATIVE ng/mL
Cocaine Metabolites: NEGATIVE ng/mL
Creatinine, Urine: 252.5 mg/dL (ref >20.0)
FENTANYL URINE: NEGATIVE ng/mL
MDMA URINE: NEGATIVE ng/mL
METHADONE SCREEN, URINE: NEGATIVE ng/mL
METHAQUALONE SCREEN (URINE): NEGATIVE ng/mL
Meperidine, Ur: NEGATIVE ng/mL
NITRITES URINE, INITIAL: NEGATIVE ug/mL
Opiate Screen, Urine: NEGATIVE ng/mL
Oxycodone Screen, Ur: NEGATIVE ng/mL
PHENCYCLIDINE, UR: NEGATIVE ng/mL
PROPOXYPHENE: NEGATIVE ng/mL
Tapentadol, urine: NEGATIVE ng/mL
Tramadol Scrn, Ur: NEGATIVE ng/mL
Zolpidem, Urine: NEGATIVE ng/mL
pH, Initial: 5.7 pH (ref 4.5–8.9)

## 2014-07-05 LAB — HEMOGLOBINOPATHY EVALUATION
Hemoglobin Other: 0 %
Hgb A2 Quant: 2.8 % (ref 2.2–3.2)
Hgb A: 97.2 % (ref 96.8–97.8)
Hgb F Quant: 0 % (ref 0.0–2.0)
Hgb S Quant: 0 %

## 2014-07-08 LAB — CYTOLOGY - PAP

## 2014-07-09 ENCOUNTER — Encounter: Payer: Self-pay | Admitting: Family

## 2014-07-25 ENCOUNTER — Ambulatory Visit (INDEPENDENT_AMBULATORY_CARE_PROVIDER_SITE_OTHER): Payer: Medicaid Other | Admitting: Physician Assistant

## 2014-07-25 VITALS — BP 124/77 | HR 99 | Wt 130.5 lb

## 2014-07-25 DIAGNOSIS — Z23 Encounter for immunization: Secondary | ICD-10-CM

## 2014-07-25 DIAGNOSIS — O0932 Supervision of pregnancy with insufficient antenatal care, second trimester: Secondary | ICD-10-CM

## 2014-07-25 LAB — POCT URINALYSIS DIP (DEVICE)
Bilirubin Urine: NEGATIVE
GLUCOSE, UA: NEGATIVE mg/dL
Hgb urine dipstick: NEGATIVE
Ketones, ur: NEGATIVE mg/dL
Nitrite: NEGATIVE
PH: 8.5 — AB (ref 5.0–8.0)
Protein, ur: NEGATIVE mg/dL
SPECIFIC GRAVITY, URINE: 1.015 (ref 1.005–1.030)
Urobilinogen, UA: 1 mg/dL (ref 0.0–1.0)

## 2014-07-25 LAB — CBC
HEMATOCRIT: 35.4 % — AB (ref 36.0–46.0)
Hemoglobin: 12 g/dL (ref 12.0–15.0)
MCH: 31.3 pg (ref 26.0–34.0)
MCHC: 33.9 g/dL (ref 30.0–36.0)
MCV: 92.4 fL (ref 78.0–100.0)
Platelets: 235 10*3/uL (ref 150–400)
RBC: 3.83 MIL/uL — AB (ref 3.87–5.11)
RDW: 12.8 % (ref 11.5–15.5)
WBC: 9.9 10*3/uL (ref 4.0–10.5)

## 2014-07-25 MED ORDER — TETANUS-DIPHTH-ACELL PERTUSSIS 5-2.5-18.5 LF-MCG/0.5 IM SUSP: 0.5000 mL | Freq: Once | INTRAMUSCULAR | Status: DC

## 2014-07-25 NOTE — Patient Instructions (Signed)
Breastfeeding Deciding to breastfeed is one of the best choices you can make for you and your baby. A change in hormones during pregnancy causes your breast tissue to grow and increases the number and size of your milk ducts. These hormones also allow proteins, sugars, and fats from your blood supply to make breast milk in your milk-producing glands. Hormones prevent breast milk from being released before your baby is born as well as prompt milk flow after birth. Once breastfeeding has begun, thoughts of your baby, as well as his or her sucking or crying, can stimulate the release of milk from your milk-producing glands.  BENEFITS OF BREASTFEEDING For Your Baby  Your first milk (colostrum) helps your baby's digestive system function better.   There are antibodies in your milk that help your baby fight off infections.   Your baby has a lower incidence of asthma, allergies, and sudden infant death syndrome.   The nutrients in breast milk are better for your baby than infant formulas and are designed uniquely for your baby's needs.   Breast milk improves your baby's brain development.   Your baby is less likely to develop other conditions, such as childhood obesity, asthma, or type 2 diabetes mellitus.  For You   Breastfeeding helps to create a very special bond between you and your baby.   Breastfeeding is convenient. Breast milk is always available at the correct temperature and costs nothing.   Breastfeeding helps to burn calories and helps you lose the weight gained during pregnancy.   Breastfeeding makes your uterus contract to its prepregnancy size faster and slows bleeding (lochia) after you give birth.   Breastfeeding helps to lower your risk of developing type 2 diabetes mellitus, osteoporosis, and breast or ovarian cancer later in life. SIGNS THAT YOUR BABY IS HUNGRY Early Signs of Hunger  Increased alertness or activity.  Stretching.  Movement of the head from  side to side.  Movement of the head and opening of the mouth when the corner of the mouth or cheek is stroked (rooting).  Increased sucking sounds, smacking lips, cooing, sighing, or squeaking.  Hand-to-mouth movements.  Increased sucking of fingers or hands. Late Signs of Hunger  Fussing.  Intermittent crying. Extreme Signs of Hunger Signs of extreme hunger will require calming and consoling before your baby will be able to breastfeed successfully. Do not wait for the following signs of extreme hunger to occur before you initiate breastfeeding:   Restlessness.  A loud, strong cry.   Screaming. BREASTFEEDING BASICS Breastfeeding Initiation  Find a comfortable place to sit or lie down, with your neck and back well supported.  Place a pillow or rolled up blanket under your baby to bring him or her to the level of your breast (if you are seated). Nursing pillows are specially designed to help support your arms and your baby while you breastfeed.  Make sure that your baby's abdomen is facing your abdomen.   Gently massage your breast. With your fingertips, massage from your chest wall toward your nipple in a circular motion. This encourages milk flow. You may need to continue this action during the feeding if your milk flows slowly.  Support your breast with 4 fingers underneath and your thumb above your nipple. Make sure your fingers are well away from your nipple and your baby's mouth.   Stroke your baby's lips gently with your finger or nipple.   When your baby's mouth is open wide enough, quickly bring your baby to your   breast, placing your entire nipple and as much of the colored area around your nipple (areola) as possible into your baby's mouth.   More areola should be visible above your baby's upper lip than below the lower lip.   Your baby's tongue should be between his or her lower gum and your breast.   Ensure that your baby's mouth is correctly positioned  around your nipple (latched). Your baby's lips should create a seal on your breast and be turned out (everted).  It is common for your baby to suck about 2-3 minutes in order to start the flow of breast milk. Latching Teaching your baby how to latch on to your breast properly is very important. An improper latch can cause nipple pain and decreased milk supply for you and poor weight gain in your baby. Also, if your baby is not latched onto your nipple properly, he or she may swallow some air during feeding. This can make your baby fussy. Burping your baby when you switch breasts during the feeding can help to get rid of the air. However, teaching your baby to latch on properly is still the best way to prevent fussiness from swallowing air while breastfeeding. Signs that your baby has successfully latched on to your nipple:    Silent tugging or silent sucking, without causing you pain.   Swallowing heard between every 3-4 sucks.    Muscle movement above and in front of his or her ears while sucking.  Signs that your baby has not successfully latched on to nipple:   Sucking sounds or smacking sounds from your baby while breastfeeding.  Nipple pain. If you think your baby has not latched on correctly, slip your finger into the corner of your baby's mouth to break the suction and place it between your baby's gums. Attempt breastfeeding initiation again. Signs of Successful Breastfeeding Signs from your baby:   A gradual decrease in the number of sucks or complete cessation of sucking.   Falling asleep.   Relaxation of his or her body.   Retention of a small amount of milk in his or her mouth.   Letting go of your breast by himself or herself. Signs from you:  Breasts that have increased in firmness, weight, and size 1-3 hours after feeding.   Breasts that are softer immediately after breastfeeding.  Increased milk volume, as well as a change in milk consistency and color by  the fifth day of breastfeeding.   Nipples that are not sore, cracked, or bleeding. Signs That Your Baby is Getting Enough Milk  Wetting at least 3 diapers in a 24-hour period. The urine should be clear and pale yellow by age 5 days.  At least 3 stools in a 24-hour period by age 5 days. The stool should be soft and yellow.  At least 3 stools in a 24-hour period by age 7 days. The stool should be seedy and yellow.  No loss of weight greater than 10% of birth weight during the first 3 days of age.  Average weight gain of 4-7 ounces (113-198 g) per week after age 4 days.  Consistent daily weight gain by age 5 days, without weight loss after the age of 2 weeks. After a feeding, your baby may spit up a small amount. This is common. BREASTFEEDING FREQUENCY AND DURATION Frequent feeding will help you make more milk and can prevent sore nipples and breast engorgement. Breastfeed when you feel the need to reduce the fullness of your breasts   or when your baby shows signs of hunger. This is called "breastfeeding on demand." Avoid introducing a pacifier to your baby while you are working to establish breastfeeding (the first 4-6 weeks after your baby is born). After this time you may choose to use a pacifier. Research has shown that pacifier use during the first year of a baby's life decreases the risk of sudden infant death syndrome (SIDS). Allow your baby to feed on each breast as long as he or she wants. Breastfeed until your baby is finished feeding. When your baby unlatches or falls asleep while feeding from the first breast, offer the second breast. Because newborns are often sleepy in the first few weeks of life, you may need to awaken your baby to get him or her to feed. Breastfeeding times will vary from baby to baby. However, the following rules can serve as a guide to help you ensure that your baby is properly fed:  Newborns (babies 4 weeks of age or younger) may breastfeed every 1-3  hours.  Newborns should not go longer than 3 hours during the day or 5 hours during the night without breastfeeding.  You should breastfeed your baby a minimum of 8 times in a 24-hour period until you begin to introduce solid foods to your baby at around 6 months of age. BREAST MILK PUMPING Pumping and storing breast milk allows you to ensure that your baby is exclusively fed your breast milk, even at times when you are unable to breastfeed. This is especially important if you are going back to work while you are still breastfeeding or when you are not able to be present during feedings. Your lactation consultant can give you guidelines on how long it is safe to store breast milk.  A breast pump is a machine that allows you to pump milk from your breast into a sterile bottle. The pumped breast milk can then be stored in a refrigerator or freezer. Some breast pumps are operated by hand, while others use electricity. Ask your lactation consultant which type will work best for you. Breast pumps can be purchased, but some hospitals and breastfeeding support groups lease breast pumps on a monthly basis. A lactation consultant can teach you how to hand express breast milk, if you prefer not to use a pump.  CARING FOR YOUR BREASTS WHILE YOU BREASTFEED Nipples can become dry, cracked, and sore while breastfeeding. The following recommendations can help keep your breasts moisturized and healthy:  Avoid using soap on your nipples.   Wear a supportive bra. Although not required, special nursing bras and tank tops are designed to allow access to your breasts for breastfeeding without taking off your entire bra or top. Avoid wearing underwire-style bras or extremely tight bras.  Air dry your nipples for 3-4minutes after each feeding.   Use only cotton bra pads to absorb leaked breast milk. Leaking of breast milk between feedings is normal.   Use lanolin on your nipples after breastfeeding. Lanolin helps to  maintain your skin's normal moisture barrier. If you use pure lanolin, you do not need to wash it off before feeding your baby again. Pure lanolin is not toxic to your baby. You may also hand express a few drops of breast milk and gently massage that milk into your nipples and allow the milk to air dry. In the first few weeks after giving birth, some women experience extremely full breasts (engorgement). Engorgement can make your breasts feel heavy, warm, and tender to the   touch. Engorgement peaks within 3-5 days after you give birth. The following recommendations can help ease engorgement:  Completely empty your breasts while breastfeeding or pumping. You may want to start by applying warm, moist heat (in the shower or with warm water-soaked hand towels) just before feeding or pumping. This increases circulation and helps the milk flow. If your baby does not completely empty your breasts while breastfeeding, pump any extra milk after he or she is finished.  Wear a snug bra (nursing or regular) or tank top for 1-2 days to signal your body to slightly decrease milk production.  Apply ice packs to your breasts, unless this is too uncomfortable for you.  Make sure that your baby is latched on and positioned properly while breastfeeding. If engorgement persists after 48 hours of following these recommendations, contact your health care provider or a lactation consultant. OVERALL HEALTH CARE RECOMMENDATIONS WHILE BREASTFEEDING  Eat healthy foods. Alternate between meals and snacks, eating 3 of each per day. Because what you eat affects your breast milk, some of the foods may make your baby more irritable than usual. Avoid eating these foods if you are sure that they are negatively affecting your baby.  Drink milk, fruit juice, and water to satisfy your thirst (about 10 glasses a day).   Rest often, relax, and continue to take your prenatal vitamins to prevent fatigue, stress, and anemia.  Continue  breast self-awareness checks.  Avoid chewing and smoking tobacco.  Avoid alcohol and drug use. Some medicines that may be harmful to your baby can pass through breast milk. It is important to ask your health care provider before taking any medicine, including all over-the-counter and prescription medicine as well as vitamin and herbal supplements. It is possible to become pregnant while breastfeeding. If birth control is desired, ask your health care provider about options that will be safe for your baby. SEEK MEDICAL CARE IF:   You feel like you want to stop breastfeeding or have become frustrated with breastfeeding.  You have painful breasts or nipples.  Your nipples are cracked or bleeding.  Your breasts are red, tender, or warm.  You have a swollen area on either breast.  You have a fever or chills.  You have nausea or vomiting.  You have drainage other than breast milk from your nipples.  Your breasts do not become full before feedings by the fifth day after you give birth.  You feel sad and depressed.  Your baby is too sleepy to eat well.  Your baby is having trouble sleeping.   Your baby is wetting less than 3 diapers in a 24-hour period.  Your baby has less than 3 stools in a 24-hour period.  Your baby's skin or the white part of his or her eyes becomes yellow.   Your baby is not gaining weight by 5 days of age. SEEK IMMEDIATE MEDICAL CARE IF:   Your baby is overly tired (lethargic) and does not want to wake up and feed.  Your baby develops an unexplained fever. Document Released: 09/27/2005 Document Revised: 10/02/2013 Document Reviewed: 03/21/2013 [employer] Patient Information 2015 [employer]. This information is not intended to replace advice given to you by your health care provider. Make sure you discuss any questions you have with your health care provider.  

## 2014-07-25 NOTE — Progress Notes (Signed)
28 weeks.  No complaints today.  No contractions.  Endorses good fetal movement.  No bleeding, LOF, dysuria. 28 week labs today along with TDAP, Flu.   [place] baby [place]   Urine culture pending RTC 2 weeks

## 2014-07-25 NOTE — Progress Notes (Signed)
28 week labs today.  

## 2014-07-26 LAB — RPR

## 2014-07-26 LAB — HIV ANTIBODY (ROUTINE TESTING W REFLEX): HIV 1&2 Ab, 4th Generation: NONREACTIVE

## 2014-07-26 LAB — GLUCOSE TOLERANCE, 1 HOUR (50G) W/O FASTING: Glucose, 1 Hour GTT: 106 mg/dL (ref 70–140)

## 2014-07-27 LAB — URINE CULTURE
Colony Count: NO GROWTH
ORGANISM ID, BACTERIA: NO GROWTH

## 2014-08-08 ENCOUNTER — Ambulatory Visit (INDEPENDENT_AMBULATORY_CARE_PROVIDER_SITE_OTHER): Payer: Medicaid Other | Admitting: Family Medicine

## 2014-08-08 VITALS — BP 121/69 | HR 80 | Temp 98.6°F | Wt 133.5 lb

## 2014-08-08 DIAGNOSIS — O0932 Supervision of pregnancy with insufficient antenatal care, second trimester: Secondary | ICD-10-CM

## 2014-08-08 LAB — POCT URINALYSIS DIP (DEVICE)
Bilirubin Urine: NEGATIVE
GLUCOSE, UA: NEGATIVE mg/dL
HGB URINE DIPSTICK: NEGATIVE
Ketones, ur: NEGATIVE mg/dL
Nitrite: NEGATIVE
Protein, ur: NEGATIVE mg/dL
SPECIFIC GRAVITY, URINE: 1.025 (ref 1.005–1.030)
Urobilinogen, UA: 1 mg/dL (ref 0.0–1.0)
pH: 6 (ref 5.0–8.0)

## 2014-08-08 NOTE — Patient Instructions (Signed)
Third Trimester of Pregnancy The third trimester is from week 29 through week 42, months 7 through 9. The third trimester is a time when the fetus is growing rapidly. At the end of the ninth month, the fetus is about 20 inches in length and weighs 6-10 pounds.  BODY CHANGES Your body goes through many changes during pregnancy. The changes vary from woman to woman.   Your weight will continue to increase. You can expect to gain 25-35 pounds (11-16 kg) by the end of the pregnancy.  You may begin to get stretch marks on your hips, abdomen, and breasts.  You may urinate more often because the fetus is moving lower into your pelvis and pressing on your bladder.  You may develop or continue to have heartburn as a result of your pregnancy.  You may develop constipation because certain hormones are causing the muscles that push waste through your intestines to slow down.  You may develop hemorrhoids or swollen, bulging veins (varicose veins).  You may have pelvic pain because of the weight gain and pregnancy hormones relaxing your joints between the bones in your pelvis. Backaches may result from overexertion of the muscles supporting your posture.  You may have changes in your hair. These can include thickening of your hair, rapid growth, and changes in texture. Some women also have hair loss during or after pregnancy, or hair that feels dry or thin. Your hair will most likely return to normal after your baby is born.  Your breasts will continue to grow and be tender. A yellow discharge may leak from your breasts called colostrum.  Your belly button may stick out.  You may feel short of breath because of your expanding uterus.  You may notice the fetus "dropping," or moving lower in your abdomen.  You may have a bloody mucus discharge. This usually occurs a few days to a week before labor begins.  Your cervix becomes thin and soft (effaced) near your due date. WHAT TO EXPECT AT YOUR PRENATAL  EXAMS  You will have prenatal exams every 2 weeks until week 36. Then, you will have weekly prenatal exams. During a routine prenatal visit:  You will be weighed to make sure you and the fetus are growing normally.  Your blood pressure is taken.  Your abdomen will be measured to track your baby's growth.  The fetal heartbeat will be listened to.  Any test results from the previous visit will be discussed.  You may have a cervical check near your due date to see if you have effaced. At around 36 weeks, your caregiver will check your cervix. At the same time, your caregiver will also perform a test on the secretions of the vaginal tissue. This test is to determine if a type of bacteria, Group B streptococcus, is present. Your caregiver will explain this further. Your caregiver may ask you:  What your birth plan is.  How you are feeling.  If you are feeling the baby move.  If you have had any abnormal symptoms, such as leaking fluid, bleeding, severe headaches, or abdominal cramping.  If you have any questions. Other tests or screenings that may be performed during your third trimester include:  Blood tests that check for low iron levels (anemia).  Fetal testing to check the health, activity level, and growth of the fetus. Testing is done if you have certain medical conditions or if there are problems during the pregnancy. FALSE LABOR You may feel small, irregular contractions that   eventually go away. These are called Braxton Hicks contractions, or false labor. Contractions may last for hours, days, or even weeks before true labor sets in. If contractions come at regular intervals, intensify, or become painful, it is best to be seen by your caregiver.  SIGNS OF LABOR   Menstrual-like cramps.  Contractions that are 5 minutes apart or less.  Contractions that start on the top of the uterus and spread down to the lower abdomen and back.  A sense of increased pelvic pressure or back  pain.  A watery or bloody mucus discharge that comes from the vagina. If you have any of these signs before the 37th week of pregnancy, call your caregiver right away. You need to go to the hospital to get checked immediately. HOME CARE INSTRUCTIONS   Avoid all smoking, herbs, alcohol, and unprescribed drugs. These chemicals affect the formation and growth of the baby.  Follow your caregiver's instructions regarding medicine use. There are medicines that are either safe or unsafe to take during pregnancy.  Exercise only as directed by your caregiver. Experiencing uterine cramps is a good sign to stop exercising.  Continue to eat regular, healthy meals.  Wear a good support bra for breast tenderness.  Do not use hot tubs, steam rooms, or saunas.  Wear your seat belt at all times when driving.  Avoid raw meat, uncooked cheese, cat litter boxes, and soil used by cats. These carry germs that can cause birth defects in the baby.  Take your prenatal vitamins.  Try taking a stool softener (if your caregiver approves) if you develop constipation. Eat more high-fiber foods, such as fresh vegetables or fruit and whole grains. Drink plenty of fluids to keep your urine clear or pale yellow.  Take warm sitz baths to soothe any pain or discomfort caused by hemorrhoids. Use hemorrhoid cream if your caregiver approves.  If you develop varicose veins, wear support hose. Elevate your feet for 15 minutes, 3-4 times a day. Limit salt in your diet.  Avoid heavy lifting, wear low heal shoes, and practice good posture.  Rest a lot with your legs elevated if you have leg cramps or low back pain.  Visit your dentist if you have not gone during your pregnancy. Use a soft toothbrush to brush your teeth and be gentle when you floss.  A sexual relationship may be continued unless your caregiver directs you otherwise.  Do not travel far distances unless it is absolutely necessary and only with the approval  of your caregiver.  Take prenatal classes to understand, practice, and ask questions about the labor and delivery.  Make a trial run to the hospital.  Pack your hospital bag.  Prepare the baby's nursery.  Continue to go to all your prenatal visits as directed by your caregiver. SEEK MEDICAL CARE IF:  You are unsure if you are in labor or if your water has broken.  You have dizziness.  You have mild pelvic cramps, pelvic pressure, or nagging pain in your abdominal area.  You have persistent nausea, vomiting, or diarrhea.  You have a bad smelling vaginal discharge.  You have pain with urination. SEEK IMMEDIATE MEDICAL CARE IF:   You have a fever.  You are leaking fluid from your vagina.  You have spotting or bleeding from your vagina.  You have severe abdominal cramping or pain.  You have rapid weight loss or gain.  You have shortness of breath with chest pain.  You notice sudden or extreme swelling   of your face, hands, ankles, feet, or legs.  You have not felt your baby move in over an hour.  You have severe headaches that do not go away with medicine.  You have vision changes. Document Released: 09/21/2001 Document Revised: 10/02/2013 Document Reviewed: 11/28/2012 Exit[employer] Patient Information 2015 [employer]. This information is not intended to replace advice given to you by your health care provider. Make sure you discuss any questions you have with your health care provider.  

## 2014-08-08 NOTE — Progress Notes (Signed)
Patient without complaints.  Denies vaginal bleeding, abnormal vaginal discharge, contractions, loss of fluid.  Denies fevers, malaise, fatigue.  Had questions about testing for toxoplasmosis due to exposure.  Discussed that without symptoms, generally do not screen for it due to low prevalence.  Reports good fetal activity.  Labor precautions reviewed.  Follow up in 2 weeks.

## 2014-08-08 NOTE — Progress Notes (Signed)
Patient reports occasional pelvic pressure

## 2014-08-12 ENCOUNTER — Encounter: Payer: Self-pay | Admitting: Family

## 2014-08-22 ENCOUNTER — Encounter: Payer: Medicaid Other | Admitting: Advanced Practice Midwife

## 2014-08-26 ENCOUNTER — Ambulatory Visit (INDEPENDENT_AMBULATORY_CARE_PROVIDER_SITE_OTHER): Payer: Medicaid Other | Admitting: Advanced Practice Midwife

## 2014-08-26 VITALS — BP 127/85 | HR 89 | Temp 98.3°F | Wt 134.8 lb

## 2014-08-26 DIAGNOSIS — L259 Unspecified contact dermatitis, unspecified cause: Secondary | ICD-10-CM

## 2014-08-26 DIAGNOSIS — O0932 Supervision of pregnancy with insufficient antenatal care, second trimester: Secondary | ICD-10-CM

## 2014-08-26 LAB — POCT URINALYSIS DIP (DEVICE)
BILIRUBIN URINE: NEGATIVE
GLUCOSE, UA: NEGATIVE mg/dL
Hgb urine dipstick: NEGATIVE
Ketones, ur: NEGATIVE mg/dL
Nitrite: NEGATIVE
Protein, ur: NEGATIVE mg/dL
SPECIFIC GRAVITY, URINE: 1.01 (ref 1.005–1.030)
Urobilinogen, UA: 1 mg/dL (ref 0.0–1.0)
pH: 5.5 (ref 5.0–8.0)

## 2014-08-26 MED ORDER — HYDROCORTISONE 0.5 % EX CREA: 1.0000 "application " | TOPICAL_CREAM | Freq: Two times a day (BID) | CUTANEOUS | Status: DC

## 2014-08-26 NOTE — Progress Notes (Signed)
Doing well.  Good fetal movement, denies vaginal bleeding, LOF, regular contractions.  Reports some occasional menstrual-like cramping, not regular or painful.  Reviewed signs of PTL/reasons to come to hospital.  Rash on upper right breast, erythemetous, pruritic.  Topical hydrocortisone BID sent to pharmacy.

## 2014-08-26 NOTE — Progress Notes (Signed)
Pt reports rash on breast, right , and lower abdominal pain.

## 2014-08-28 ENCOUNTER — Encounter: Payer: Medicaid Other | Admitting: Family

## 2014-09-10 ENCOUNTER — Inpatient Hospital Stay (HOSPITAL_COMMUNITY)
Admission: AD | Admit: 2014-09-10 | Discharge: 2014-09-10 | Disposition: A | Discharge status: Home / Self Care | Payer: Medicaid Other | Source: Ambulatory Visit | Attending: Obstetrics and Gynecology | Admitting: Obstetrics and Gynecology

## 2014-09-10 ENCOUNTER — Encounter (HOSPITAL_COMMUNITY): Payer: Self-pay | Admitting: *Deleted

## 2014-09-10 DIAGNOSIS — O0932 Supervision of pregnancy with insufficient antenatal care, second trimester: Secondary | ICD-10-CM

## 2014-09-10 DIAGNOSIS — E860 Dehydration: Secondary | ICD-10-CM | POA: Insufficient documentation

## 2014-09-10 DIAGNOSIS — R000 Tachycardia, unspecified: Secondary | ICD-10-CM | POA: Insufficient documentation

## 2014-09-10 DIAGNOSIS — Z3A34 34 weeks gestation of pregnancy: Secondary | ICD-10-CM | POA: Insufficient documentation

## 2014-09-10 DIAGNOSIS — O479 False labor, unspecified: Secondary | ICD-10-CM

## 2014-09-10 DIAGNOSIS — O9989 Other specified diseases and conditions complicating pregnancy, childbirth and the puerperium: Secondary | ICD-10-CM | POA: Diagnosis not present

## 2014-09-10 DIAGNOSIS — R0602 Shortness of breath: Secondary | ICD-10-CM | POA: Diagnosis present

## 2014-09-10 LAB — URINALYSIS, ROUTINE W REFLEX MICROSCOPIC
Bilirubin Urine: NEGATIVE
Glucose, UA: 100 mg/dL — AB
Hgb urine dipstick: NEGATIVE
KETONES UR: NEGATIVE mg/dL
NITRITE: NEGATIVE
PROTEIN: NEGATIVE mg/dL
Specific Gravity, Urine: <1.005 — ABNORMAL LOW (ref 1.005–1.030)
UROBILINOGEN UA: 0.2 mg/dL (ref 0.0–1.0)
pH: 6.5 (ref 5.0–8.0)

## 2014-09-10 LAB — URINE MICROSCOPIC-ADD ON

## 2014-09-10 NOTE — Discharge Instructions (Signed)
Normal Labor and Delivery ° °Your caregiver must first be sure you are in labor. Signs of labor include: ° °· You may pass what is called "the mucus plug" before labor begins. This is a small amount of blood stained mucus. °· Regular uterine contractions. °· The time between contractions get closer together. °· The discomfort and pain gradually gets more intense. °· Pains are mostly located in the back. °· Pains get worse when walking. °· The cervix (the opening of the uterus becomes thinner (begins to efface) and opens up (dilates). ° ° °Once you are in labor and admitted into the hospital or care center, your caregiver will do the following: ° °· A complete physical examination. °· Check your vital signs (blood pressure, pulse, temperature and the fetal heart rate). °· Do a vaginal examination (using a sterile glove and lubricant) to determine: °· The position (presentation) of the baby (head [vertex] or buttock first). °· The level (station) of the baby's head in the birth canal. °· The effacement and dilatation of the cervix. °· An electronic monitor is usually placed on your abdomen. The monitor follows the length and intensity of the contractions, as well as the baby's heart rate. °· your caregiver may insert an IV in your arm with a bottle of sugar water. This is done as a precaution so that medications can be given to you quickly during labor or delivery. ° ° °NORMAL LABOR AND DELIVERY IS DIVIDED UP INTO 3 STAGES: ° °First Stage °This is when regular contractions begin and the cervix begins to efface and dilate. This stage can last from 3 to 15 hours. The end of the first stage is when the cervix is 100% effaced and 10 centimeters dilated. Pain medications may be given by  °· Injection (morphine, demerol, etc.) °· Regional anesthesia (spinal, caudal or epidural, anesthetics given in different locations of the spine). Paracervical pain medication may be given, which is an injection of and anesthetic on each  side of the cervix. °A pregnant woman may request to have "Natural Childbirth" which is not to have any medications or anesthesia during her labor and delivery. ° °Second Stage °This is when the baby comes down through the birth canal (vagina) and is born. This can take 1 to 4 hours. As the baby's head comes down through the birth canal, you may feel like you are going to have a bowel movement. You will get the urge to bear down and push until the baby is delivered. As the baby's head is being delivered, the caregiver will decide if an episiotomy (a cut in the perineum and vagina area) is needed to prevent tearing of the tissue in this area. The episiotomy is sewn up after the delivery of the baby and placenta. Sometimes a mask with nitrous oxide is given for the mother to breath during the delivery of the baby to help if there is too much pain. The end of Stage 2 is when the baby is fully delivered. Then when the umbilical cord stops pulsating it is clamped and cut. ° °Third Stage °The third stage begins after the baby is completely delivered and ends after the placenta (afterbirth) is delivered. This usually takes 5 to 30 minutes. After the placenta is delivered, a medication is given either by intravenous or injection to help contract the uterus and prevent bleeding. The third stage is not painful and pain medication is usually not necessary. If there was a tear, it is repaired at this time. °  After the delivery, the mother is watched and monitored closely for 1 to 2 hours to make sure there is no postpartum bleeding (hemorrhage). If there is a lot of bleeding, medication is given to contract the uterus and stop the bleeding. ° ° °Document Released: 07/06/2008 Document Revised: 12/20/2011 Document Reviewed: 07/06/2008 °Exit[employer]® Patient Information ©2013 [employer]. ° ° °

## 2014-09-10 NOTE — MAU Provider Note (Signed)
History     CSN: [id number]  Arrival date and time: 09/10/14 2011   None     Chief Complaint  Patient presents with  . Anxiety  . Tachycardia  . Dizziness   HPI  Patient is 23 y.o. G2P0010 [id number] here with complaints of mild tachycardia, intermittent SOB, anxiety, and spots in her visual field. Yesterday [name] starting feeling a general sense of malaise and went home from work early. Today at work symptoms of tachycardia (max HR 120), increased respiratory rate began and continued after returning home. She reported seeing spots in her visual field for 1 minute while standing. She does not use caffeine, and this has not previously happened. She denies N/V/D, chest pain, fever, and recent illness.   +FM, denies LOF, VB, vaginal discharge.     Past Medical History  Diagnosis Date  . Medical history non-contributory   . Constipation     Past Surgical History  Procedure Laterality Date  . Wisdom tooth extraction      Family History  Problem Relation Age of Onset  . Alcohol abuse Neg Hx   . Arthritis Neg Hx   . Asthma Neg Hx   . Birth defects Neg Hx   . Cancer Neg Hx   . COPD Neg Hx   . Depression Neg Hx   . Diabetes Neg Hx   . Drug abuse Neg Hx   . Early death Neg Hx   . Heart disease Neg Hx   . Hearing loss Neg Hx   . Hyperlipidemia Neg Hx   . Hypertension Neg Hx   . Kidney disease Neg Hx   . Learning disabilities Neg Hx   . Mental illness Neg Hx   . Mental retardation Neg Hx   . Miscarriages / Stillbirths Neg Hx   . Stroke Neg Hx   . Vision loss Neg Hx   . Varicose Veins Neg Hx     History  Substance Use Topics  . Smoking status: Never Smoker   . Smokeless tobacco: Never Used  . Alcohol Use: Yes     Comment: socially-stopped with pregnancy    Allergies: No Known Allergies  Prescriptions prior to admission  Medication Sig Dispense Refill Last Dose  . calcium carbonate (TUMS - DOSED IN MG ELEMENTAL CALCIUM) 500 MG chewable tablet Chew 2-4  tablets by mouth daily as needed for indigestion or heartburn.   Past Week at Unknown time  . Famotidine (PEPCID PO) Take 1 tablet by mouth as needed (heartburn).   Past Week at Unknown time  . hydrocortisone cream 0.5 % Apply 1 application topically 2 (two) times daily. 30 g 0 Past Week at Unknown time  . polyethylene glycol (MIRALAX / GLYCOLAX) packet Take 17 g by mouth daily as needed for mild constipation or moderate constipation.    Past Month at Unknown time  . Prenatal Vit-Fe Fumarate-FA (PRENATAL VITAMINS PLUS) 27-1 MG TABS Take 1 tablet by mouth daily.   09/09/2014 at Unknown time    Review of Systems  Constitutional: Negative for fever and chills.  Respiratory: Negative for cough and shortness of breath.   Cardiovascular: Negative for chest pain and leg swelling.  Gastrointestinal: Positive for abdominal pain. Negative for heartburn, nausea, vomiting and diarrhea.  Genitourinary: Positive for urgency. Negative for dysuria, frequency and hematuria.  Neurological:       No headache   Physical Exam   Blood pressure 131/84, pulse 99, temperature 98.1 F (36.7 C), temperature source Oral, resp. rate  18, height 5\' 3"  (1.6 m), weight 135 lb (61.236 kg), last menstrual period 06/14/2013.  Physical Exam  Constitutional: She is oriented to person, place, and time. She appears well-developed and well-nourished.  HENT:  Head: Normocephalic and atraumatic.  Eyes: Conjunctivae and EOM are normal.  Neck: Normal range of motion.  Cardiovascular: Normal rate, regular rhythm and normal heart sounds.   Respiratory: Effort normal. No respiratory distress.  GI: Soft. She exhibits no distension. There is no tenderness.  Musculoskeletal: Normal range of motion. She exhibits no edema.  Neurological: She is alert and oriented to person, place, and time.  Skin: Skin is warm and dry. No erythema.   Dilation: 1 Effacement (%): 60 Station: -3 Exam by:: Dr Loreta AveAcosta  MAU Course   Procedures  MDM NST reactive EKG: NSR, HR 96  Labs: Results for orders placed or performed during the hospital encounter of 09/10/14 (from the past 24 hour(s))  Urinalysis, Routine w reflex microscopic   Collection Time: 09/10/14  8:45 PM  Result Value Ref Range   Color, Urine YELLOW YELLOW   APPearance CLEAR CLEAR   Specific Gravity, Urine <1.005 (L) 1.005 - 1.030   pH 6.5 5.0 - 8.0   Glucose, UA 100 (A) NEGATIVE mg/dL   Hgb urine dipstick NEGATIVE NEGATIVE   Bilirubin Urine NEGATIVE NEGATIVE   Ketones, ur NEGATIVE NEGATIVE mg/dL   Protein, ur NEGATIVE NEGATIVE mg/dL   Urobilinogen, UA 0.2 0.0 - 1.0 mg/dL   Nitrite NEGATIVE NEGATIVE   Leukocytes, UA TRACE (A) NEGATIVE  Urine microscopic-add on   Collection Time: 09/10/14  8:45 PM  Result Value Ref Range   Squamous Epithelial / LPF RARE RARE   WBC, UA 3-6 <3 WBC/hpf   RBC / HPF 0-2 <3 RBC/hpf   Bacteria, UA FEW (A) RARE    Imaging Studies:  No results found.  EKG: NSR, no abnormalities  Assessment and Plan  Patient is 23 y.o. G2P0010 [id number] reporting tachycardia likely secondary to mild dehydration. - fetal kick counts reinforced - labor precautions - PO hydration encouraged   Christine Oliver 09/10/2014, 9:54 PM

## 2014-09-11 LAB — T4, FREE: Free T4: 1.09 ng/dL (ref 0.80–1.80)

## 2014-09-11 LAB — TSH: TSH: 2.22 u[IU]/mL (ref 0.350–4.500)

## 2014-09-12 ENCOUNTER — Ambulatory Visit (INDEPENDENT_AMBULATORY_CARE_PROVIDER_SITE_OTHER): Payer: Medicaid Other | Admitting: Physician Assistant

## 2014-09-12 ENCOUNTER — Other Ambulatory Visit: Payer: Self-pay | Admitting: Physician Assistant

## 2014-09-12 VITALS — BP 129/73 | HR 108 | Temp 98.3°F | Wt 135.7 lb

## 2014-09-12 DIAGNOSIS — O365930 Maternal care for other known or suspected poor fetal growth, third trimester, not applicable or unspecified: Secondary | ICD-10-CM | POA: Insufficient documentation

## 2014-09-12 DIAGNOSIS — O0932 Supervision of pregnancy with insufficient antenatal care, second trimester: Secondary | ICD-10-CM

## 2014-09-12 DIAGNOSIS — R81 Glycosuria: Secondary | ICD-10-CM | POA: Insufficient documentation

## 2014-09-12 LAB — POCT URINALYSIS DIP (DEVICE)
BILIRUBIN URINE: NEGATIVE
GLUCOSE, UA: 500 mg/dL — AB
HGB URINE DIPSTICK: NEGATIVE
KETONES UR: 15 mg/dL — AB
Leukocytes, UA: NEGATIVE
Nitrite: NEGATIVE
PH: 5.5 (ref 5.0–8.0)
Protein, ur: NEGATIVE mg/dL
SPECIFIC GRAVITY, URINE: 1.02 (ref 1.005–1.030)
Urobilinogen, UA: 0.2 mg/dL (ref 0.0–1.0)

## 2014-09-12 LAB — OB RESULTS CONSOLE GC/CHLAMYDIA
Chlamydia: NEGATIVE
GC PROBE AMP, GENITAL: NEGATIVE

## 2014-09-12 LAB — OB RESULTS CONSOLE GBS: GBS: POSITIVE

## 2014-09-12 NOTE — Progress Notes (Signed)
Was seen in MAU on Tuesday night. Pt has been reporting contractions since then and reports seeing her mucous plug

## 2014-09-12 NOTE — Patient Instructions (Signed)
Braxton Hicks Contractions °Contractions of the uterus can occur throughout pregnancy. Contractions are not always a sign that you are in labor.  °WHAT ARE BRAXTON HICKS CONTRACTIONS?  °Contractions that occur before labor are called Braxton Hicks contractions, or false labor. Toward the end of pregnancy (32-34 weeks), these contractions can develop more often and may become more forceful. This is not true labor because these contractions do not result in opening (dilatation) and thinning of the cervix. They are sometimes difficult to tell apart from true labor because these contractions can be forceful and people have different pain tolerances. You should not feel embarrassed if you go to the hospital with false labor. Sometimes, the only way to tell if you are in true labor is for your health care provider to look for changes in the cervix. °If there are no prenatal problems or other health problems associated with the pregnancy, it is completely safe to be sent home with false labor and await the onset of true labor. °HOW CAN YOU TELL THE DIFFERENCE BETWEEN TRUE AND FALSE LABOR? °False Labor °· The contractions of false labor are usually shorter and not as hard as those of true labor.   °· The contractions are usually irregular.   °· The contractions are often felt in the front of the lower abdomen and in the groin.   °· The contractions may go away when you walk around or change positions while lying down.   °· The contractions get weaker and are shorter lasting as time goes on.   °· The contractions do not usually become progressively stronger, regular, and closer together as with true labor.   °True Labor °· Contractions in true labor last 30-70 seconds, become very regular, usually become more intense, and increase in frequency.   °· The contractions do not go away with walking.   °· The discomfort is usually felt in the top of the uterus and spreads to the lower abdomen and low back.   °· True labor can be  determined by your health care provider with an exam. This will show that the cervix is dilating and getting thinner.   °WHAT TO REMEMBER °· Keep up with your usual exercises and follow other instructions given by your health care provider.   °· Take medicines as directed by your health care provider.   °· Keep your regular prenatal appointments.   °· Eat and drink lightly if you think you are going into labor.   °· If Braxton Hicks contractions are making you uncomfortable:   °¨ Change your position from lying down or resting to walking, or from walking to resting.   °¨ Sit and rest in a tub of warm water.   °¨ Drink 2-3 glasses of water. Dehydration may cause these contractions.   °¨ Do slow and deep breathing several times an hour.   °WHEN SHOULD I SEEK IMMEDIATE MEDICAL CARE? °Seek immediate medical care if: °· Your contractions become stronger, more regular, and closer together.   °· You have fluid leaking or gushing from your vagina.   °· You have a fever.   °· You pass blood-tinged mucus.   °· You have vaginal bleeding.   °· You have continuous abdominal pain.   °· You have low back pain that you never had before.   °· You feel your baby's head pushing down and causing pelvic pressure.   °· Your baby is not moving as much as it used to.   °Document Released: 09/27/2005 Document Revised: 10/02/2013 Document Reviewed: 07/09/2013 °[employer]® Patient Information ©2015 [employer]. This information is not intended to replace advice given to you by your health care   provider. Make sure you discuss any questions you have with your health care provider. ° °

## 2014-09-12 NOTE — Progress Notes (Signed)
35 weeks, has been experiencing irregular contractions and was seen in MAU for tachycardia.  Symptoms are presently improved.  She notes good fetal movement and denies vaginal bleeding, dysuria, LOF.  She has noticed significantly increased thirst over the last 2.5 weeks.   GTT to be scheduled again (Discussed with Dr. Jolayne Pantheronstant) GBS/GC/Chlam pending U/S for size < dates OBF 1 week

## 2014-09-13 LAB — GC/CHLAMYDIA PROBE AMP
CT Probe RNA: NEGATIVE
GC PROBE AMP APTIMA: NEGATIVE

## 2014-09-14 LAB — CULTURE, BETA STREP (GROUP B ONLY)

## 2014-09-16 ENCOUNTER — Ambulatory Visit (HOSPITAL_COMMUNITY)
Admission: RE | Admit: 2014-09-16 | Discharge: 2014-09-16 | Disposition: A | Discharge status: Home / Self Care | Payer: Medicaid Other | Source: Ambulatory Visit | Attending: Physician Assistant | Admitting: Physician Assistant

## 2014-09-16 ENCOUNTER — Other Ambulatory Visit: Payer: Self-pay | Admitting: Physician Assistant

## 2014-09-16 DIAGNOSIS — O365930 Maternal care for other known or suspected poor fetal growth, third trimester, not applicable or unspecified: Secondary | ICD-10-CM | POA: Diagnosis present

## 2014-09-16 DIAGNOSIS — O3433 Maternal care for cervical incompetence, third trimester: Secondary | ICD-10-CM | POA: Diagnosis not present

## 2014-09-16 DIAGNOSIS — O0932 Supervision of pregnancy with insufficient antenatal care, second trimester: Secondary | ICD-10-CM

## 2014-09-16 DIAGNOSIS — Z3A35 35 weeks gestation of pregnancy: Secondary | ICD-10-CM | POA: Insufficient documentation

## 2014-09-16 NOTE — Progress Notes (Signed)
Christine ShropshireBrianna Oliver  was seen today for an ultrasound appointment.  See scanned report in media section of EPIC.  Impression: Single IUP at 35w 4d Patient seen due to S<D The estimated fetal weight today measures < 5th %tile.  The AC measures < 2nd %tile. Normal amniotic fluid volume UA Doppler studies: Elevated UA Doppler studies (> 95th %tile)  without evidence of AEDF or REDF BPP 8/8  Recommendations: Recommend 2x weekly NSTs with weekly AFI and UA Doppler studies. If testing is otherwise reassuring, recommend delivery at 37-[redacted] weeks gestation.  Alpha GulaPaul Gunnar Hereford, MD

## 2014-09-17 ENCOUNTER — Ambulatory Visit (INDEPENDENT_AMBULATORY_CARE_PROVIDER_SITE_OTHER): Payer: Medicaid Other | Admitting: *Deleted

## 2014-09-17 VITALS — BP 134/80 | HR 98

## 2014-09-17 DIAGNOSIS — O0933 Supervision of pregnancy with insufficient antenatal care, third trimester: Secondary | ICD-10-CM

## 2014-09-17 DIAGNOSIS — O365930 Maternal care for other known or suspected poor fetal growth, third trimester, not applicable or unspecified: Secondary | ICD-10-CM

## 2014-09-17 NOTE — Progress Notes (Signed)
Repeat 1hr GTT today due to pt report of excessive thirst x3 weeks.   Begin 2x/wk fetal testing due to SGA & elevated doppler.  UA doppler and AFI scheduled @ [facility] on 12/15.

## 2014-09-17 NOTE — Progress Notes (Signed)
12/8 NST reviewed and reactive

## 2014-09-18 LAB — GLUCOSE TOLERANCE, 1 HOUR (50G) W/O FASTING: GLUCOSE 1 HOUR GTT: 120 mg/dL (ref 70–140)

## 2014-09-19 ENCOUNTER — Telehealth: Payer: Self-pay | Admitting: *Deleted

## 2014-09-19 ENCOUNTER — Ambulatory Visit (INDEPENDENT_AMBULATORY_CARE_PROVIDER_SITE_OTHER): Payer: Medicaid Other | Admitting: Advanced Practice Midwife

## 2014-09-19 VITALS — BP 123/79 | HR 78 | Temp 98.6°F | Wt 133.9 lb

## 2014-09-19 DIAGNOSIS — O0932 Supervision of pregnancy with insufficient antenatal care, second trimester: Secondary | ICD-10-CM

## 2014-09-19 DIAGNOSIS — O365930 Maternal care for other known or suspected poor fetal growth, third trimester, not applicable or unspecified: Secondary | ICD-10-CM

## 2014-09-19 LAB — POCT URINALYSIS DIP (DEVICE)
Bilirubin Urine: NEGATIVE
Glucose, UA: NEGATIVE mg/dL
Hgb urine dipstick: NEGATIVE
Ketones, ur: NEGATIVE mg/dL
Nitrite: NEGATIVE
PROTEIN: NEGATIVE mg/dL
Specific Gravity, Urine: 1.01 (ref 1.005–1.030)
UROBILINOGEN UA: 1 mg/dL (ref 0.0–1.0)
pH: 6.5 (ref 5.0–8.0)

## 2014-09-19 NOTE — Patient Instructions (Signed)
Intrauterine Growth Restriction Intrauterine growth restriction (IUGR) means that the baby is smaller than normal at the time of the pregnancy or at birth. This should not be confused with Small for Gestational Age (SGA), which means the baby's weight at birth is at the lower end (less than 10%) of normal birth weights.  CAUSES  Medical problems with the mother:  High blood pressure.  Kidney, lung or heart disease.  Diabetes with arteriosclerosis.  Hemoglobinopathies- blood diseases.  Antiphospholipid antibody syndrome - a disorder of the immune system. Other causes:  Smoking, drug abuse and excessive alcohol drinking.  Diseases of the placenta.  Having twins or more.  Malnutrition.  Infections.  Genetic problems.  Pregnant women 47 years old or younger and pregnant women 72 years old or older.  Exposure to toxic chemicals. SYMPTOMS  Smaller than normal uterus when measuring the uterine size on the abdomen.  Ultrasound measurements of the fetuses head circumference, the abdominal circumference, the diameter of the biparietal area (sides) of the head and the length of the femur less than normal indicating IUGR. RISKS AND COMPLICATIONS  Fetal death in the uterus or a stillborn baby.  Not having enough fluid in the baby's sac (oligohydramnios).  Fetal heart rate problems. This leads to more Cesarean Section deliveries.  Low Apgar scores (evaluates the baby's condition at birth).  Increase in the acidity of the baby's blood (acidosis). SGA babies can also have complications such as:  Low blood sugar.  Increase of bilirubin in the blood.  Low body temperature (hypothermia)  Low Apgar scores, convulsions, fetal death and stillborn. TREATMENT   Close following and monitoring of the fetus during the pregnancy.  Treat infections that may be present.  Treat and control the medical disease present.  Look at the condition of the fetus with non-tress tests,  contraction stress tests and biophysical profile of the fetus.  Doppler ultrasound (measure the umbilical artery blood flow) lowers the risk of fetal death and stillborn by delivering the baby early if it is abnormal. HOME CARE INSTRUCTIONS   Follow your caregiver's advice, instructions and keep all of your prenatal appointments.  Get plenty of rest and sleep.  Eat a balanced diet and take all your vitamin and mineral supplements.  Do not over use your energy with hard exercise, work and household activities.  Do not exercise unless your caregiver says it is OK to do so.  Do not smoke, drink alcohol or take illegal drugs.  Avoid chemicals like pesticides. SEEK MEDICAL CARE IF:   You develop a temperature of 100 F (37.8 C) or higher.  You do not feel the baby moving as much or not at all.  You develop leaking of fluid from the vagina.  You develop vaginal bleeding.  You develop abdominal pain.  You develop uterine contractions. Document Released: 07/06/2008 Document Revised: 02/11/2014 Document Reviewed: 07/06/2008 [facility] Patient Information 2015 [place], [place]. This information is not intended to replace advice given to you by your health care Nakoa Ganus. Make sure you discuss any questions you have with your health care Dillion Stowers.  Labor Induction  Labor induction is when steps are taken to cause a pregnant woman to begin the labor process. Most women go into labor on their own between 37 weeks and 42 weeks of the pregnancy. When this does not happen or when there is a medical need, methods may be used to induce labor. Labor induction causes a pregnant woman's uterus to contract. It also causes the cervix to soften (  ripen), open (dilate), and thin out (efface). Usually, labor is not induced before 39 weeks of the pregnancy unless there is a problem with the baby or mother.  Before inducing labor, your health care Bostyn Kunkler will consider a number of factors, including the  following:  The medical condition of you and the baby.   How many weeks along you are.   The status of the baby's lung maturity.   The condition of the cervix.   The position of the baby.  WHAT ARE THE REASONS FOR LABOR INDUCTION? Labor may be induced for the following reasons:  The health of the baby or mother is at risk.   The pregnancy is overdue by 1 week or more.   The water breaks but labor does not start on its own.   The mother has a health condition or serious illness, such as high blood pressure, infection, placental abruption, or diabetes.  The amniotic fluid amounts are low around the baby.   The baby is distressed.  Convenience or wanting the baby to be born on a certain date is not a reason for inducing labor. WHAT METHODS ARE USED FOR LABOR INDUCTION? Several methods of labor induction may be used, such as:   Prostaglandin medicine. This medicine causes the cervix to dilate and ripen. The medicine will also start contractions. It can be taken by mouth or by inserting a suppository into the vagina.   Inserting a thin tube (catheter) with a balloon on the end into the vagina to dilate the cervix. Once inserted, the balloon is expanded with water, which causes the cervix to open.   Stripping the membranes. Your health care Jae Skeet separates amniotic sac tissue from the cervix, causing the cervix to be stretched and causing the release of a hormone called progesterone. This may cause the uterus to contract. It is often done during an office visit. You will be sent home to wait for the contractions to begin. You will then come in for an induction.   Breaking the water. Your health care Cameren Odwyer makes a hole in the amniotic sac using a small instrument. Once the amniotic sac breaks, contractions should begin. This may still take hours to see an effect.   Medicine to trigger or strengthen contractions. This medicine is given through an IV access tube  inserted into a vein in your arm.  All of the methods of induction, besides stripping the membranes, will be done in the hospital. Induction is done in the hospital so that you and the baby can be carefully monitored.  HOW LONG DOES IT TAKE FOR LABOR TO BE INDUCED? Some inductions can take up to 2-3 days. Depending on the cervix, it usually takes less time. It takes longer when you are induced early in the pregnancy or if this is your first pregnancy. If a mother is still pregnant and the induction has been going on for 2-3 days, either the mother will be sent home or a cesarean delivery will be needed. WHAT ARE THE RISKS ASSOCIATED WITH LABOR INDUCTION? Some of the risks of induction include:   Changes in fetal heart rate, such as too high, too low, or erratic.   Fetal distress.   Chance of infection for the mother and baby.   Increased chance of having a cesarean delivery.   Breaking off (abruption) of the placenta from the uterus (rare).   Uterine rupture (very rare).  When induction is needed for medical reasons, the benefits of induction may outweigh  the risks. WHAT ARE SOME REASONS FOR NOT INDUCING LABOR? Labor induction should not be done if:   It is shown that your baby does not tolerate labor.   You have had previous surgeries on your uterus, such as a myomectomy or the removal of fibroids.   Your placenta lies very low in the uterus and blocks the opening of the cervix (placenta previa).   Your baby is not in a head-down position.   The umbilical cord drops down into the birth canal in front of the baby. This could cut off the baby's blood and oxygen supply.   You have had a previous cesarean delivery.   There are unusual circumstances, such as the baby being extremely premature.  Document Released: 02/16/2007 Document Revised: 05/30/2013 Document Reviewed: 04/26/2013 [facility] Patient Information 2015 [place]. This information is not intended to  replace advice given to you by your health care Makynna Manocchio. Make sure you discuss any questions you have with your health care Omari Mcmanaway.

## 2014-09-19 NOTE — Progress Notes (Signed)
Induction scheduled for 12/17 at 730 pm

## 2014-09-19 NOTE — Progress Notes (Signed)
Pt receives weekly AFI/UA doppler @ [facility] on Tuesdays.  Nml 1hr GTT on 12/8.

## 2014-09-19 NOTE — Progress Notes (Signed)
I have seen this patient and agree with the previous midwife student's note today.  IOL scheduled at 37 weeks per [facility]FM recommendation r/t IUGR <5%tile and increased dopplers.  LEFTWICH-KIRBY, LISA Certified Nurse-Midwife

## 2014-09-19 NOTE — Telephone Encounter (Signed)
Called pt and advised her that she will not need appt as scheduled on 12/17 @ 1400 since she will be admitted later that evening for IOL.  Pt reminded of appt @ [facility] on 12/15 @ 0800 and she stated she will keep the appt.  Pt voiced understanding of all information given.

## 2014-09-19 NOTE — Progress Notes (Signed)
Doing well. No complaints offered.  Denies vaginal bleeding, cramping or LOF.  Discussed IOL process for S<D per [facility] recommendations to pt and her mother, bother voiced understanding.  Consulted with Dr. Erin FullingHarraway-Smith, induction to be done at 37 wks.  Induction scheduled and pt made aware of date.

## 2014-09-23 ENCOUNTER — Encounter: Payer: Self-pay | Admitting: *Deleted

## 2014-09-24 ENCOUNTER — Encounter (HOSPITAL_COMMUNITY): Payer: Self-pay

## 2014-09-24 ENCOUNTER — Ambulatory Visit (HOSPITAL_COMMUNITY)
Admission: RE | Admit: 2014-09-24 | Discharge: 2014-09-24 | Disposition: A | Discharge status: Home / Self Care | Payer: Medicaid Other | Source: Ambulatory Visit | Attending: Obstetrics and Gynecology | Admitting: Obstetrics and Gynecology

## 2014-09-24 ENCOUNTER — Telehealth (HOSPITAL_COMMUNITY): Payer: Self-pay | Admitting: *Deleted

## 2014-09-24 DIAGNOSIS — O0933 Supervision of pregnancy with insufficient antenatal care, third trimester: Secondary | ICD-10-CM | POA: Insufficient documentation

## 2014-09-24 DIAGNOSIS — O365930 Maternal care for other known or suspected poor fetal growth, third trimester, not applicable or unspecified: Secondary | ICD-10-CM | POA: Insufficient documentation

## 2014-09-24 DIAGNOSIS — Z3A36 36 weeks gestation of pregnancy: Secondary | ICD-10-CM | POA: Insufficient documentation

## 2014-09-24 NOTE — Telephone Encounter (Signed)
Preadmission screen  

## 2014-09-25 ENCOUNTER — Encounter: Payer: Medicaid Other | Admitting: Obstetrics and Gynecology

## 2014-09-26 ENCOUNTER — Encounter (HOSPITAL_COMMUNITY): Payer: Self-pay

## 2014-09-26 ENCOUNTER — Inpatient Hospital Stay (HOSPITAL_COMMUNITY)
Admission: RE | Admit: 2014-09-26 | Discharge: 2014-09-29 | DRG: 775 | Disposition: A | Discharge status: Home / Self Care | Payer: Medicaid Other | Source: Ambulatory Visit | Attending: Obstetrics & Gynecology | Admitting: Obstetrics & Gynecology

## 2014-09-26 VITALS — BP 124/77 | HR 66 | Temp 98.2°F | Resp 18 | Ht 63.0 in | Wt 134.0 lb

## 2014-09-26 DIAGNOSIS — O365930 Maternal care for other known or suspected poor fetal growth, third trimester, not applicable or unspecified: Secondary | ICD-10-CM | POA: Diagnosis present

## 2014-09-26 DIAGNOSIS — Z3A37 37 weeks gestation of pregnancy: Secondary | ICD-10-CM | POA: Diagnosis present

## 2014-09-26 DIAGNOSIS — O0932 Supervision of pregnancy with insufficient antenatal care, second trimester: Secondary | ICD-10-CM

## 2014-09-26 DIAGNOSIS — O365990 Maternal care for other known or suspected poor fetal growth, unspecified trimester, not applicable or unspecified: Secondary | ICD-10-CM | POA: Diagnosis present

## 2014-09-26 DIAGNOSIS — O99824 Streptococcus B carrier state complicating childbirth: Secondary | ICD-10-CM | POA: Diagnosis present

## 2014-09-26 LAB — CBC
HCT: 32.3 % — ABNORMAL LOW (ref 36.0–46.0)
Hemoglobin: 11 g/dL — ABNORMAL LOW (ref 12.0–15.0)
MCH: 31 pg (ref 26.0–34.0)
MCHC: 34.1 g/dL (ref 30.0–36.0)
MCV: 91 fL (ref 78.0–100.0)
Platelets: 224 10*3/uL (ref 150–400)
RBC: 3.55 MIL/uL — ABNORMAL LOW (ref 3.87–5.11)
RDW: 12.2 % (ref 11.5–15.5)
WBC: 9.4 10*3/uL (ref 4.0–10.5)

## 2014-09-26 MED ORDER — CITRIC ACID-SODIUM CITRATE 334-500 MG/5ML PO SOLN
30.0000 mL | ORAL | Status: DC | PRN
  Administered 2014-09-27: 30 mL via ORAL
  Filled 2014-09-26: qty 15

## 2014-09-26 MED ORDER — PENICILLIN G POTASSIUM 5000000 UNITS IJ SOLR
2.5000 10*6.[IU] | INTRAVENOUS | Status: DC
  Administered 2014-09-27 (×4): 2.5 10*6.[IU] via INTRAVENOUS
  Filled 2014-09-26 (×11): qty 2.5

## 2014-09-26 MED ORDER — FENTANYL CITRATE 0.05 MG/ML IJ SOLN
50.0000 ug | INTRAMUSCULAR | Status: DC | PRN
  Administered 2014-09-27 (×4): 50 ug via INTRAVENOUS
  Filled 2014-09-26 (×4): qty 2

## 2014-09-26 MED ORDER — LACTATED RINGERS IV SOLN
INTRAVENOUS | Status: DC
  Administered 2014-09-26 – 2014-09-27 (×4): via INTRAVENOUS

## 2014-09-26 MED ORDER — OXYTOCIN 40 UNITS IN LACTATED RINGERS INFUSION - SIMPLE MED
62.5000 mL/h | INTRAVENOUS | Status: DC
  Administered 2014-09-27: 62.5 mL/h via INTRAVENOUS
  Filled 2014-09-26: qty 1000

## 2014-09-26 MED ORDER — ONDANSETRON HCL 4 MG/2ML IJ SOLN
4.0000 mg | Freq: Four times a day (QID) | INTRAMUSCULAR | Status: DC | PRN
  Administered 2014-09-27: 4 mg via INTRAVENOUS
  Filled 2014-09-26: qty 2

## 2014-09-26 MED ORDER — LACTATED RINGERS IV SOLN
500.0000 mL | INTRAVENOUS | Status: DC | PRN
  Administered 2014-09-27: 500 mL via INTRAVENOUS

## 2014-09-26 MED ORDER — OXYCODONE-ACETAMINOPHEN 5-325 MG PO TABS: 1.0000 | ORAL_TABLET | ORAL | Status: DC | PRN

## 2014-09-26 MED ORDER — TERBUTALINE SULFATE 1 MG/ML IJ SOLN: 0.2500 mg | Freq: Once | INTRAMUSCULAR | Status: AC | PRN

## 2014-09-26 MED ORDER — MISOPROSTOL 25 MCG QUARTER TABLET
25.0000 ug | ORAL_TABLET | ORAL | Status: DC | PRN
  Administered 2014-09-26 – 2014-09-27 (×2): 25 ug via VAGINAL
  Filled 2014-09-26: qty 0.25
  Filled 2014-09-26: qty 1
  Filled 2014-09-26: qty 0.25

## 2014-09-26 MED ORDER — LIDOCAINE HCL (PF) 1 % IJ SOLN
30.0000 mL | INTRAMUSCULAR | Status: DC | PRN
  Filled 2014-09-26: qty 30

## 2014-09-26 MED ORDER — OXYTOCIN BOLUS FROM INFUSION
500.0000 mL | INTRAVENOUS | Status: DC
  Administered 2014-09-27: 500 mL via INTRAVENOUS

## 2014-09-26 MED ORDER — FLEET ENEMA 7-19 GM/118ML RE ENEM: 1.0000 | ENEMA | RECTAL | Status: DC | PRN

## 2014-09-26 MED ORDER — OXYCODONE-ACETAMINOPHEN 5-325 MG PO TABS: 2.0000 | ORAL_TABLET | ORAL | Status: DC | PRN

## 2014-09-26 MED ORDER — ZOLPIDEM TARTRATE 5 MG PO TABS: 5.0000 mg | ORAL_TABLET | Freq: Every evening | ORAL | Status: DC | PRN

## 2014-09-26 MED ORDER — PENICILLIN G POTASSIUM 5000000 UNITS IJ SOLR
5.0000 10*6.[IU] | Freq: Once | INTRAVENOUS | Status: AC
  Administered 2014-09-26: 5 10*6.[IU] via INTRAVENOUS
  Filled 2014-09-26: qty 5

## 2014-09-26 MED ORDER — ACETAMINOPHEN 325 MG PO TABS: 650.0000 mg | ORAL_TABLET | ORAL | Status: DC | PRN

## 2014-09-26 NOTE — H&P (Signed)
Christine Oliver is a 23 y.o. female G2P0010 with IUP at 875w0d presenting for IOL with abnormal dopplers[facility]  Prenatal History/Complications:  Late PNC, IUGR  Past Medical History: Past Medical History  Diagnosis Date  . Medical history non-contributory   . Constipation     Past Surgical History: Past Surgical History  Procedure Laterality Date  . Wisdom tooth extraction      Obstetrical History: OB History    Gravida Para Term Preterm AB TAB SAB Ectopic Multiple Living   2    1  1    0      Social History: History   Social History  . Marital Status: Single    Spouse Name: N/A    Number of Children: N/A  . Years of Education: N/A   Social History Main Topics  . Smoking status: Never Smoker   . Smokeless tobacco: Never Used  . Alcohol Use: Yes     Comment: socially-stopped with pregnancy  . Drug Use: No  . Sexual Activity: Yes    Birth Control/ Protection: None     Comment: came out unknown to pt.    Other Topics Concern  . Not on file   Social History Narrative    Family History: Family History  Problem Relation Age of Onset  . Alcohol abuse Neg Hx   . Arthritis Neg Hx   . Asthma Neg Hx   . Birth defects Neg Hx   . Cancer Neg Hx   . COPD Neg Hx   . Depression Neg Hx   . Diabetes Neg Hx   . Drug abuse Neg Hx   . Early death Neg Hx   . Heart disease Neg Hx   . Hearing loss Neg Hx   . Hyperlipidemia Neg Hx   . Hypertension Neg Hx   . Kidney disease Neg Hx   . Learning disabilities Neg Hx   . Mental illness Neg Hx   . Mental retardation Neg Hx   . Miscarriages / Stillbirths Neg Hx   . Stroke Neg Hx   . Vision loss Neg Hx   . Varicose Veins Neg Hx     Allergies: No Known Allergies  Prescriptions prior to admission  Medication Sig Dispense Refill Last Dose  . Famotidine (PEPCID PO) Take 1 tablet by mouth as needed (heartburn).   Taking  . polyethylene glycol (MIRALAX / GLYCOLAX) packet Take 17 g by mouth daily as  needed for mild constipation or moderate constipation.    Taking  . Prenatal Vit-Fe Fumarate-FA (PRENATAL VITAMINS PLUS) 27-1 MG TABS Take 1 tablet by mouth daily.   Taking     Prenatal Transfer Tool  Maternal Diabetes: No Genetic Screening: Declined Maternal Ultrasounds/Referrals: Abnormal:  Findings:   IUGR Fetal Ultrasounds or other Referrals:  None Maternal Substance Abuse:  No Significant Maternal Medications:  None Significant Maternal Lab Results: Lab values include: Group B Strep positive    Review of Systems   Constitutional: Negative for fever and chills Eyes: Negative for visual disturbances Respiratory: Negative for shortness of breath, dyspnea Cardiovascular: Negative for chest pain or palpitations  Gastrointestinal: Negative for vomiting, diarrhea and constipation Genitourinary: Negative for dysuria and urgency Musculoskeletal: Negative for back pain, joint pain, myalgias  Neurological: Negative for dizziness and headaches    Last menstrual period 06/14/2013. General appearance: alert, cooperative and no distress Lungs: clear to auscultation bilaterally Heart: regular rate and rhythm Abdomen: soft, non-tender; bowel sounds normal Pelvic: 1/thick/-2 Extremities:  Homans sign is negative, no sign of DVT DTR's 2+ Presentation: cephalic Fetal monitoring    Baseline: 140 bpm, Variability: Good {> 6 bpm), Accelerations: Reactive and Decelerations: Absent Uterine activityDate/time of onset: none      Prenatal labs: ABO, Rh: AB/POS/-- (09/23 1332) Antibody: NEG (09/23 1332) Rubella:  immune RPR: NON REAC (10/15 1429)  HBsAg: NEGATIVE (09/23 1332)  HIV: NONREACTIVE (10/15 1429)  GBS: Positive (12/03 0000)  1 hr Glucola 120 Genetic screening  Too late Anatomy US normal   No results found for this or any previous visit (from the past 24 hour(s)).  Assessment: Christine ShropshireBrianna Oliver is a 23 y.o. G2P0010 with an IUP at 299w0d presenting for IOL for IUGR with abnormal  dopplers  Plan: #Labor: cytotec>foley>pitocin #Pain:  IV/epidural #FWB Cat 1 #ID: GBS: Pos:  PCN  #MOF:  breast #MOC: ? #Circ: girl   CRESENZO-DISHMAN,Jagjit Riner 09/26/2014, 7:29 PM

## 2014-09-27 ENCOUNTER — Inpatient Hospital Stay (HOSPITAL_COMMUNITY): Payer: Medicaid Other | Admitting: Anesthesiology

## 2014-09-27 ENCOUNTER — Encounter (HOSPITAL_COMMUNITY): Payer: Self-pay

## 2014-09-27 DIAGNOSIS — Z3A37 37 weeks gestation of pregnancy: Secondary | ICD-10-CM

## 2014-09-27 DIAGNOSIS — O99824 Streptococcus B carrier state complicating childbirth: Secondary | ICD-10-CM

## 2014-09-27 LAB — HIV ANTIBODY (ROUTINE TESTING W REFLEX): HIV: NONREACTIVE

## 2014-09-27 LAB — RPR

## 2014-09-27 MED ORDER — DIPHENHYDRAMINE HCL 50 MG/ML IJ SOLN: 12.5000 mg | INTRAMUSCULAR | Status: DC | PRN

## 2014-09-27 MED ORDER — EPHEDRINE 5 MG/ML INJ
10.0000 mg | INTRAVENOUS | Status: DC | PRN
  Filled 2014-09-27: qty 2

## 2014-09-27 MED ORDER — TERBUTALINE SULFATE 1 MG/ML IJ SOLN: 0.2500 mg | Freq: Once | INTRAMUSCULAR | Status: AC | PRN

## 2014-09-27 MED ORDER — IBUPROFEN 600 MG PO TABS
600.0000 mg | ORAL_TABLET | Freq: Four times a day (QID) | ORAL | Status: DC
  Administered 2014-09-27 – 2014-09-29 (×7): 600 mg via ORAL
  Filled 2014-09-27 (×7): qty 1

## 2014-09-27 MED ORDER — PHENYLEPHRINE 40 MCG/ML (10ML) SYRINGE FOR IV PUSH (FOR BLOOD PRESSURE SUPPORT)
80.0000 ug | PREFILLED_SYRINGE | INTRAVENOUS | Status: DC | PRN
  Filled 2014-09-27: qty 10
  Filled 2014-09-27: qty 2

## 2014-09-27 MED ORDER — FENTANYL 2.5 MCG/ML BUPIVACAINE 1/10 % EPIDURAL INFUSION (WH - ANES): 14.0000 mL/h | INTRAMUSCULAR | Status: DC | PRN

## 2014-09-27 MED ORDER — FENTANYL 2.5 MCG/ML BUPIVACAINE 1/10 % EPIDURAL INFUSION (WH - ANES)
14.0000 mL/h | INTRAMUSCULAR | Status: DC | PRN
  Administered 2014-09-27 (×2): 14 mL/h via EPIDURAL
  Filled 2014-09-27 (×2): qty 125

## 2014-09-27 MED ORDER — FAMOTIDINE 20 MG PO TABS
40.0000 mg | ORAL_TABLET | Freq: Every day | ORAL | Status: DC | PRN
  Administered 2014-09-27: 40 mg via ORAL
  Filled 2014-09-27: qty 2

## 2014-09-27 MED ORDER — OXYTOCIN 40 UNITS IN LACTATED RINGERS INFUSION - SIMPLE MED
1.0000 m[IU]/min | INTRAVENOUS | Status: DC
  Administered 2014-09-27: 2 m[IU]/min via INTRAVENOUS

## 2014-09-27 MED ORDER — FENTANYL 2.5 MCG/ML BUPIVACAINE 1/10 % EPIDURAL INFUSION (WH - ANES)
INTRAMUSCULAR | Status: DC | PRN
  Administered 2014-09-27: 14 mL/h via EPIDURAL

## 2014-09-27 MED ORDER — LACTATED RINGERS IV SOLN
500.0000 mL | Freq: Once | INTRAVENOUS | Status: AC
  Administered 2014-09-27: 500 mL via INTRAVENOUS

## 2014-09-27 MED ORDER — PHENYLEPHRINE 40 MCG/ML (10ML) SYRINGE FOR IV PUSH (FOR BLOOD PRESSURE SUPPORT)
80.0000 ug | PREFILLED_SYRINGE | INTRAVENOUS | Status: DC | PRN
  Filled 2014-09-27: qty 2

## 2014-09-27 MED ORDER — LIDOCAINE HCL (PF) 1 % IJ SOLN
INTRAMUSCULAR | Status: DC | PRN
  Administered 2014-09-27: 4 mL
  Administered 2014-09-27: 6 mL

## 2014-09-27 NOTE — Progress Notes (Signed)
Christine Oliver is a 23 y.o. G2P0010 at 5090w1d admitted for induction of labor due to IUGR.  Subjective: Awake resting quietly, no complaints offered.  Objective: BP 140/90 mmHg  Pulse 74  Temp(Src) 98.6 F (37 C) (Oral)  Resp 16  Ht 5\' 3"  (1.6 m)  Wt 134 lb (60.782 kg)  BMI 23.74 kg/m2  LMP 06/14/2013    FHT:  FHR: 130 bpm, variability: moderate,  accelerations:  Present,  decelerations:  Present early decelerations UC:   regular, every 2 minutes  SVE:   Dilation: 1 Effacement (%): 50 Station: -2 Exam by:: L.Stubbs, RN    Labs: Lab Results  Component Value Date   WBC 9.4 09/26/2014   HGB 11.0* 09/26/2014   HCT 32.3* 09/26/2014   MCV 91.0 09/26/2014   PLT 224 09/26/2014    Assessment / Plan: SROM - clear fluids  Cervical foley balloon inserted without difficulty with 60mls of LR. Patient tolerated well. Fetal Wellbeing:  Category I Pain Control:  Fentanyl Pre-eclampsia: N/A I/D:  N/A Anticipated MOD:  NSVD  ADAMS,SHNIQUAL SHWON Student NM 09/27/2014, 6:08 AM

## 2014-09-27 NOTE — Anesthesia Procedure Notes (Signed)

## 2014-09-27 NOTE — Progress Notes (Signed)
Patient ID: Christine Oliver, female   DOB: 02/09/1991, 23 y.o.   MRN: [id number] Roger ShelterDong well.  Foley still in place  FHR stable.  UCs every 4-5 min  Dilation: 1.5 Effacement (%): 50 Cervical Position: Posterior Station: -2, -3 Presentation: Vertex Exam by:: S. Adams  Will continue to observe

## 2014-09-27 NOTE — Anesthesia Preprocedure Evaluation (Signed)

## 2014-09-27 NOTE — Progress Notes (Signed)
Christine Oliver is a 23 y.o. G2P0010 at 5254w1d admitted for induction of labor due to IUGR.  Subjective: Resting quietly, offering no complaints.  Objective: BP 125/70 mmHg  Pulse 86  Temp(Src) 98.6 F (37 C) (Oral)  Resp 16  Ht 5\' 3"  (1.6 m)  Wt 134 lb (60.782 kg)  BMI 23.74 kg/m2  LMP 06/14/2013    FHT:  FHR: 140 bpm, variability: moderate,  accelerations:  Present,  decelerations:  Present : occasional variables UC:   regular, every 2-4 minutes  SVE:   Dilation: 1 Effacement (%): 50 Station: -2 Exam by:: L.Stubbs, RN    Labs: Lab Results  Component Value Date   WBC 9.4 09/26/2014   HGB 11.0* 09/26/2014   HCT 32.3* 09/26/2014   MCV 91.0 09/26/2014   PLT 224 09/26/2014    Assessment / Plan: Induction of labor with cytotec, will continue to monitor  Labor: Not in labor at this time Fetal Wellbeing:  Category I Pain Control:  Labor support without medications Pre-eclampsia: N/A  I/D:  N/A Anticipated MOD:  NSVD  ADAMS,SHNIQUAL SHWON Student NM 09/27/2014, 2:00 AM

## 2014-09-27 NOTE — Plan of Care (Signed)
Problem: Phase I Progression Outcomes Goal: Pitocin as ordered Outcome: Progressing Foley bulb discontinued. Progressing well with plan of care. Administering IV Pitocin for labor management and further cervical change.

## 2014-09-27 NOTE — Progress Notes (Signed)
Patient ID: Christine ShropshireBrianna Oliver, female   DOB: 1990-12-30, 23 y.o.   MRN: [id number] Doing well Comfortable with epidural  Filed Vitals:   09/27/14 1205 09/27/14 1207 09/27/14 1212 09/27/14 1218  BP:  125/83 136/74 130/67  Pulse: 83 89 82 75  Temp:    98.3 F (36.8 C)  TempSrc:    Oral  Resp:  18 18 18   Height:      Weight:       FHR stable UCs every 2-3 min  Dilation: 3.5 Effacement (%): 80 Cervical Position: Middle Station: -3 Presentation: Vertex Exam by:: Kiva Norland CNM  Balloon not palpable, suspect it has migrated above baby's head  Will try to reposition

## 2014-09-28 MED ORDER — ONDANSETRON HCL 4 MG/2ML IJ SOLN: 4.0000 mg | INTRAMUSCULAR | Status: DC | PRN

## 2014-09-28 MED ORDER — SIMETHICONE 80 MG PO CHEW: 80.0000 mg | CHEWABLE_TABLET | ORAL | Status: DC | PRN

## 2014-09-28 MED ORDER — SENNOSIDES-DOCUSATE SODIUM 8.6-50 MG PO TABS
2.0000 | ORAL_TABLET | ORAL | Status: DC
  Administered 2014-09-28 (×2): 2 via ORAL
  Filled 2014-09-28 (×2): qty 2

## 2014-09-28 MED ORDER — SODIUM CHLORIDE 0.9 % IV SOLN: 250.0000 mL | INTRAVENOUS | Status: DC | PRN

## 2014-09-28 MED ORDER — WITCH HAZEL-GLYCERIN EX PADS: 1.0000 "application " | MEDICATED_PAD | CUTANEOUS | Status: DC | PRN

## 2014-09-28 MED ORDER — BISACODYL 10 MG RE SUPP: 10.0000 mg | Freq: Every day | RECTAL | Status: DC | PRN

## 2014-09-28 MED ORDER — SODIUM CHLORIDE 0.9 % IJ SOLN: 3.0000 mL | Freq: Two times a day (BID) | INTRAMUSCULAR | Status: DC

## 2014-09-28 MED ORDER — PRENATAL MULTIVITAMIN CH
1.0000 | ORAL_TABLET | Freq: Every day | ORAL | Status: DC
  Administered 2014-09-28 – 2014-09-29 (×2): 1 via ORAL
  Filled 2014-09-28 (×2): qty 1

## 2014-09-28 MED ORDER — ONDANSETRON HCL 4 MG PO TABS
4.0000 mg | ORAL_TABLET | ORAL | Status: DC | PRN
Start: 2014-09-28 — End: 2014-09-29

## 2014-09-28 MED ORDER — OXYTOCIN 40 UNITS IN LACTATED RINGERS INFUSION - SIMPLE MED: 62.5000 mL/h | INTRAVENOUS | Status: DC | PRN

## 2014-09-28 MED ORDER — ZOLPIDEM TARTRATE 5 MG PO TABS: 5.0000 mg | ORAL_TABLET | Freq: Every evening | ORAL | Status: DC | PRN

## 2014-09-28 MED ORDER — BENZOCAINE-MENTHOL 20-0.5 % EX AERO
1.0000 "application " | INHALATION_SPRAY | CUTANEOUS | Status: DC | PRN
  Administered 2014-09-28: 1 via TOPICAL
  Filled 2014-09-28: qty 56

## 2014-09-28 MED ORDER — LANOLIN HYDROUS EX OINT: TOPICAL_OINTMENT | CUTANEOUS | Status: DC | PRN

## 2014-09-28 MED ORDER — DIPHENHYDRAMINE HCL 25 MG PO CAPS: 25.0000 mg | ORAL_CAPSULE | Freq: Four times a day (QID) | ORAL | Status: DC | PRN

## 2014-09-28 MED ORDER — FLEET ENEMA 7-19 GM/118ML RE ENEM: 1.0000 | ENEMA | Freq: Every day | RECTAL | Status: DC | PRN

## 2014-09-28 MED ORDER — SODIUM CHLORIDE 0.9 % IJ SOLN: 3.0000 mL | INTRAMUSCULAR | Status: DC | PRN

## 2014-09-28 MED ORDER — DIBUCAINE 1 % RE OINT: 1.0000 "application " | TOPICAL_OINTMENT | RECTAL | Status: DC | PRN

## 2014-09-28 NOTE — Anesthesia Postprocedure Evaluation (Signed)
Anesthesia Post Note  Patient: Christine ShropshireBrianna Sheffer  Procedure(s) Performed: * No procedures listed *  Anesthesia type: Epidural  Patient location: Mother/Baby  Post pain: Pain level controlled  Post assessment: Post-op Vital signs reviewed  Last Vitals:  Filed Vitals:   09/28/14 0453  BP: 133/85  Pulse: 75  Temp: 36.6 C  Resp: 18    Post vital signs: Reviewed  Level of consciousness:alert  Complications: No apparent anesthesia complications

## 2014-09-28 NOTE — Lactation Note (Signed)
This note was copied from the chart of Christine Barbee ShropshireBrianna Oliver. Lactation Consultation Note     Initial consult with this mom and baby, now 5016 hours old, 37 1/[redacted] weeks gestation, and small at 4 lbs 9 oz. Mom has been breast feeding, without nipple shield on left, with shield on right. Mom was given a DEP and pumped  7 mls of colostrum, which was fed by bottle to the baby. The baby began showing cues 30 minute later, and mom breast fed in cross cradle hold for 12 minutes, and then kept baby skin to skin. The pediatrician asked that we begin formula supplement with 20 cal Alimentum, as needed, according to the LPT baby protocol. This was reviewed with mom and MGM. Mom demonstrated hand expression with great technique. The plan is for mom to feed at least every 3 hours, or more if baby cues, to breast feed for 15 minutes every 3, and then offer EBM/Fromula as supplement, by bottle, and then for mom to pump in premie setting, followed by hand expression, and use this EBM for the next feeding. If the baby wants to breast feed more than every 3 hours, I told mom to not limit her time at the breast, and to keep her skin to skin. Mom very receptive to teaching, and handle baby well. Mom will cll for questions/concerns.   Patient Name: Christine Oliver Reason for consult: Initial assessment;Infant < 6lbs;Other (Comment) (b aby 37 1/7 weeks, weight 4 1/2 pounds)   Maternal Data Formula Feeding for Exclusion: No Has patient been taught Hand Expression?: Yes Does the patient have breastfeeding experience prior to this delivery?: No  Feeding Feeding Type: Breast Milk Length of feed: 12 min  LATCH Score/Interventions Latch: Grasps breast easily, tongue down, lips flanged, rhythmical sucking. Intervention(s): Adjust position;Assist with latch;Breast massage;Breast compression  Audible Swallowing: A few with stimulation  Type of Nipple: Everted at rest and after stimulation  Comfort  (Breast/Nipple): Soft / non-tender     Hold (Positioning): Assistance needed to correctly position infant at breast and maintain latch. Intervention(s): Breastfeeding basics reviewed;Support Pillows;Position options;Skin to skin  LATCH Score: 8  Lactation Tools Discussed/Used [facility]IC Program: No [facility] DEP on discharge to home) Pump Review: Setup, frequency, and cleaning;Milk Storage;Other (comment) (hand expression, review of LPT infant protocol) Initiated by:: Christine BattenKim, Rn Date initiated:: 09/28/14   Consult Status Consult Status: Follow-up Date: 09/29/14 Follow-up type: In-patient    Christine LevinsLee, Christine Oliver, 1:02 PM

## 2014-09-28 NOTE — Progress Notes (Signed)
Post Partum Day 1 Subjective:  Christine Oliver is a 23 y.o. G2P1011 7834w1d s/p SVD IOL 2/2 SGA with elevated dopplers.  No acute events overnight.  Pt denies problems with ambulating, voiding or po intake.  She denies nausea or vomiting.  Pain is well controlled.  Plan for birth control is undecided.  Method of Feeding: breast  Objective: Blood pressure 133/85, pulse 75, temperature 97.8 F (36.6 C), temperature source Oral, resp. rate 18, height 5\' 3"  (1.6 m), weight 134 lb (60.782 kg), last menstrual period 06/14/2013, SpO2 98 %, unknown if currently breastfeeding.  Physical Exam:  General: alert, cooperative and no distress Lochia:normal flow Chest: CTAB Heart: RRR no m/r/g Abdomen: +BS, soft, nontender,  Uterine Fundus: firm DVT Evaluation: No evidence of DVT seen on physical exam. Extremities: no edema   Recent Labs  09/26/14 1955  HGB 11.0*  HCT 32.3*    Assessment/Plan:  ASSESSMENT: Christine Oliver is a 23 y.o. G2P1011 3534w1d s/p SVD  Plan for discharge tomorrow   LOS: 2 days   Kairy Folsom ROCIO 09/28/2014, 7:34 AM

## 2014-09-29 ENCOUNTER — Ambulatory Visit: Payer: Self-pay

## 2014-09-29 MED ORDER — IBUPROFEN 600 MG PO TABS: 600.0000 mg | ORAL_TABLET | Freq: Four times a day (QID) | ORAL | Status: AC | PRN

## 2014-09-29 NOTE — Lactation Note (Signed)
This note was copied from the chart of [name]. Lactation Consultation Note  Patient Name: Girl Barbee ShropshireBrianna Noblet ZOXWR'UToday's Date: 09/29/2014 Reason for consult: Follow-up assessment;Pump rental;Difficult latch;Infant < 6lbs;Late preterm infant Baby 36 hours of life. Baby being supplemented with EBM when LC entered room. [facility]. Baby staying another night. Enc mom to keep putting baby to breast first, then supplement and post-pump. Enc mom to call at next feeding for assistance with latching.   Maternal Data    Feeding Feeding Type: Breast Fed Length of feed: 15 min  LATCH Score/Interventions                      Lactation Tools Discussed/Used     Consult Status Consult Status: PRN    Geralynn OchsWILLIARD, Zinia Innocent 09/29/2014, 9:01 AM

## 2014-09-29 NOTE — Discharge Summary (Signed)
Obstetric Discharge Summary Reason for Admission: induction of labor d/t SGA w/ abnormal dopplers Prenatal Procedures: NST and ultrasound Intrapartum Procedures: spontaneous vaginal delivery and GBS prophylaxis Postpartum Procedures: none Complications-Operative and Postpartum: 1st  degree perineal laceration Eating, drinking, voiding, ambulating well.  +flatus.  Lochia and pain wnl.  Denies dizziness, lightheadedness, or sob. No complaints.   Hospital Course: Christine Oliver is a 23 y.o. 272P1011 female admited at 37.0wks for IOL d/t SGA w/ abnormal dopplers. She progressed to uncomplicated SVB after cytotec x 2, FB, and pitocin. Her pp course has been uncomplicated.  By PPD#2 she is doing well and is deemed to have received the full benefit of her hospital stay.  Filed Vitals:   09/29/14 0557  BP: 124/77  Pulse: 66  Temp: 98.2 F (36.8 C)  Resp: 18   H/H: Lab Results  Component Value Date/Time   HGB 11.0* 09/26/2014 07:55 PM   HCT 32.3* 09/26/2014 07:55 PM    Physical Exam: General: alert, cooperative and no distress Abdomen/Uterine Fundus: Appropriately tender, non-distended, FF @ U-2 Incision: n/a Lochia: appropriate Extremities: No evidence of DVT seen on physical exam. Negative Homan's sign, no cords, calf tenderness, or significant calf/ankle edema   Discharge Diagnoses: Term Pregnancy-delivered  Discharge Information: Date: 04/22/2011 Activity: pelvic rest Diet: routine  Medications: PNV and Ibuprofen Breast feeding: Yes Contraception: undecided, discussed options, abstinence until obtains contraception Circumcision: n/a Condition: stable Instructions: refer to handout Discharge to: home  Infant: Home with mother  Follow-up Information    [facility]   Specialty:  Obstetrics and Gynecology   Why:  4-6 weeks for your postpartum visit   Contact information:   [address] [place] [id number] [phone]       Christine Oliver, Christine Oliver, CNM, WHNP-BC 09/29/2014,7:17 AM

## 2014-09-29 NOTE — Lactation Note (Signed)
This note was copied from the chart of Christine Oliver Lucken. Lactation Consultation Note  Patient Name: Christine Oliver Loretto WJXBJ'YToday's Date: 09/29/2014 Reason for consult: Follow-up assessment;Infant < 6lbs Baby 38 hours of life. Mom call for assistance with latch. Assisted mom to latch baby in football position to right breast. Mom reports this position more comfortable than cross-cradle. Mom able to hand express colostrum easily. Baby latches deeply, suckling rhythmically with intermittent swallows noted. Discussed with mom that baby's frenulum appears tight. Baby able to extend tongue well across gumline but not able to lift well to palate. Enc mom to discuss with pediatrician, especially if nipple pain continues. Mom reports for now that she has increased comfort with football position. Enc mom to continue post-pumping due to baby's early gestation, low weight, and tongue restriction. Enc mom to call for assistance with latching as needed.   Maternal Data    Feeding Feeding Type: Breast Fed  LATCH Score/Interventions Latch: Grasps breast easily, tongue down, lips flanged, rhythmical sucking. Intervention(s): Assist with latch;Adjust position  Audible Swallowing: Spontaneous and intermittent  Type of Nipple: Everted at rest and after stimulation  Comfort (Breast/Nipple): Soft / non-tender     Hold (Positioning): Assistance needed to correctly position infant at breast and maintain latch. Intervention(s): Support Pillows;Position options  LATCH Score: 9  Lactation Tools Discussed/Used Tools: Pump Breast pump type: Double-Electric Breast Pump   Consult Status Consult Status: Follow-up Date: 09/30/14 Follow-up type: In-patient    Geralynn OchsWILLIARD, Anacleto Batterman 09/29/2014, 11:16 AM

## 2014-09-29 NOTE — Discharge Instructions (Signed)
Postpartum Care After Vaginal Delivery  °After you deliver your newborn (postpartum period), the usual stay in the hospital is 24-72 hours. If there were problems with your labor or delivery, or if you have other medical problems, you might be in the hospital longer.  °While you are in the hospital, you will receive help and instructions on how to care for yourself and your newborn during the postpartum period.  °While you are in the hospital:  °Be sure to tell your nurses if you have pain or discomfort, as well as where you feel the pain and what makes the pain worse.  °If you had an incision made near your vagina (episiotomy) or if you had some tearing during delivery, the nurses may put ice packs on your episiotomy or tear. The ice packs may help to reduce the pain and swelling.  °If you are breastfeeding, you may feel uncomfortable contractions of your uterus for a couple of weeks. This is normal. The contractions help your uterus get back to normal size.  °It is normal to have some bleeding after delivery.  °For the first 1-3 days after delivery, the flow is red and the amount may be similar to a period.  °It is common for the flow to start and stop.  °In the first few days, you may pass some small clots. Let your nurses know if you begin to pass large clots or your flow increases.  °Do not flush blood clots down the toilet before having the nurse look at them.  °During the next 3-10 days after delivery, your flow should become more watery and pink or brown-tinged in color.  °Ten to fourteen days after delivery, your flow should be a small amount of yellowish-white discharge.  °The amount of your flow will decrease over the first few weeks after delivery. Your flow may stop in 6-8 weeks. Most women have had their flow stop by 12 weeks after delivery. °You should change your sanitary pads frequently.  °Wash your hands thoroughly with soap and water for at least 20 seconds after changing pads, using the toilet,  or before holding or feeding your newborn.  °You should feel like you need to empty your bladder within the first 6-8 hours after delivery.  °In case you become weak, lightheaded, or faint, call your nurse before you get out of bed for the first time and before you take a shower for the first time.  °Within the first few days after delivery, your breasts may begin to feel tender and full. This is called engorgement. Breast tenderness usually goes away within 48-72 hours after engorgement occurs. You may also notice milk leaking from your breasts. If you are not breastfeeding, do not stimulate your breasts. Breast stimulation can make your breasts produce more milk.  °Spending as much time as possible with your newborn is very important. During this time, you and your newborn can feel close and get to know each other. Having your newborn stay in your room (rooming in) will help to strengthen the bond with your newborn. It will give you time to get to know your newborn and become comfortable caring for your newborn.  °Your hormones change after delivery. Sometimes the hormone changes can temporarily cause you to feel sad or tearful. These feelings should not last more than a few days. If these feelings last longer than that, you should talk to your caregiver.  °If desired, talk to your caregiver about methods of family planning or contraception.  °  Talk to your caregiver about immunizations. Your caregiver may want you to have the following immunizations before leaving the hospital:  °Tetanus, diphtheria, and pertussis (Tdap) or tetanus and diphtheria (Td) immunization. It is very important that you and your family (including grandparents) or others caring for your newborn are up-to-date with the Tdap or Td immunizations. The Tdap or Td immunization can help protect your newborn from getting ill.  °Rubella immunization.  °Varicella (chickenpox) immunization.  °Influenza immunization. You should receive this annual  immunization if you did not receive the immunization during your pregnancy. °Document Released: 07/25/2007 Document Revised: 06/21/2012 Document Reviewed: 05/24/2012  °Exit[employer]® Patient Information ©2015 [employer]. This information is not intended to replace advice given to you by your health care provider. Make sure you discuss any questions you have with your health care provider.  ° °Breastfeeding °Deciding to breastfeed is one of the best choices you can make for you and your baby. A change in hormones during pregnancy causes your breast tissue to grow and increases the number and size of your milk ducts. These hormones also allow proteins, sugars, and fats from your blood supply to make breast milk in your milk-producing glands. Hormones prevent breast milk from being released before your baby is born as well as prompt milk flow after birth. Once breastfeeding has begun, thoughts of your baby, as well as his or her sucking or crying, can stimulate the release of milk from your milk-producing glands.  °BENEFITS OF BREASTFEEDING °For Your Baby °· Your first milk (colostrum) helps your baby's digestive system function better.   °· There are antibodies in your milk that help your baby fight off infections.   °· Your baby has a lower incidence of asthma, allergies, and sudden infant death syndrome.   °· The nutrients in breast milk are better for your baby than infant formulas and are designed uniquely for your baby's needs.   °· Breast milk improves your baby's brain development.   °· Your baby is less likely to develop other conditions, such as childhood obesity, asthma, or type 2 diabetes mellitus.   °For You  °· Breastfeeding helps to create a very special bond between you and your baby.   °· Breastfeeding is convenient. Breast milk is always available at the correct temperature and costs nothing.   °· Breastfeeding helps to burn calories and helps you lose the weight gained during pregnancy.    °· Breastfeeding makes your uterus contract to its prepregnancy size faster and slows bleeding (lochia) after you give birth.   °· Breastfeeding helps to lower your risk of developing type 2 diabetes mellitus, osteoporosis, and breast or ovarian cancer later in life. °SIGNS THAT YOUR BABY IS HUNGRY °Early Signs of Hunger  °· Increased alertness or activity. °· Stretching. °· Movement of the head from side to side. °· Movement of the head and opening of the mouth when the corner of the mouth or cheek is stroked (rooting). °· Increased sucking sounds, smacking lips, cooing, sighing, or squeaking. °· Hand-to-mouth movements. °· Increased sucking of fingers or hands. °Late Signs of Hunger °· Fussing. °· Intermittent crying. °Extreme Signs of Hunger °Signs of extreme hunger will require calming and consoling before your baby will be able to breastfeed successfully. Do not wait for the following signs of extreme hunger to occur before you initiate breastfeeding:   °· Restlessness. °· A loud, strong cry. °·  Screaming. °BREASTFEEDING BASICS °Breastfeeding Initiation °· Find a comfortable place to sit or lie down, with your neck and back well supported. °· Place a pillow or   rolled up blanket under your baby to bring him or her to the level of your breast (if you are seated). Nursing pillows are specially designed to help support your arms and your baby while you breastfeed. °· Make sure that your baby's abdomen is facing your abdomen.   °· Gently massage your breast. With your fingertips, massage from your chest wall toward your nipple in a circular motion. This encourages milk flow. You may need to continue this action during the feeding if your milk flows slowly. °· Support your breast with 4 fingers underneath and your thumb above your nipple. Make sure your fingers are well away from your nipple and your baby's mouth.   °· Stroke your baby's lips gently with your finger or nipple.   °· When your baby's mouth is open  wide enough, quickly bring your baby to your breast, placing your entire nipple and as much of the colored area around your nipple (areola) as possible into your baby's mouth.   °¨ More areola should be visible above your baby's upper lip than below the lower lip.   °¨ Your baby's tongue should be between his or her lower gum and your breast.   °· Ensure that your baby's mouth is correctly positioned around your nipple (latched). Your baby's lips should create a seal on your breast and be turned out (everted). °· It is common for your baby to suck about 2-3 minutes in order to start the flow of breast milk. °Latching °Teaching your baby how to latch on to your breast properly is very important. An improper latch can cause nipple pain and decreased milk supply for you and poor weight gain in your baby. Also, if your baby is not latched onto your nipple properly, he or she may swallow some air during feeding. This can make your baby fussy. Burping your baby when you switch breasts during the feeding can help to get rid of the air. However, teaching your baby to latch on properly is still the best way to prevent fussiness from swallowing air while breastfeeding. °Signs that your baby has successfully latched on to your nipple:    °· Silent tugging or silent sucking, without causing you pain.   °· Swallowing heard between every 3-4 sucks.   °·  Muscle movement above and in front of his or her ears while sucking.   °Signs that your baby has not successfully latched on to nipple:  °· Sucking sounds or smacking sounds from your baby while breastfeeding. °· Nipple pain. °If you think your baby has not latched on correctly, slip your finger into the corner of your baby's mouth to break the suction and place it between your baby's gums. Attempt breastfeeding initiation again. °Signs of Successful Breastfeeding °Signs from your baby:   °· A gradual decrease in the number of sucks or complete cessation of sucking.   °· Falling  asleep.   °· Relaxation of his or her body.   °· Retention of a small amount of milk in his or her mouth.   °· Letting go of your breast by himself or herself. °Signs from you: °· Breasts that have increased in firmness, weight, and size 1-3 hours after feeding.   °· Breasts that are softer immediately after breastfeeding. °· Increased milk volume, as well as a change in milk consistency and color by the fifth day of breastfeeding.   °· Nipples that are not sore, cracked, or bleeding. °Signs That Your Baby is Getting Enough Milk °· Wetting at least 3 diapers in a 24-hour period. The urine should be clear and   pale yellow by age 5 days. °· At least 3 stools in a 24-hour period by age 5 days. The stool should be soft and yellow. °· At least 3 stools in a 24-hour period by age 7 days. The stool should be seedy and yellow. °· No loss of weight greater than 10% of birth weight during the first 3 days of age. °· Average weight gain of 4-7 ounces (113-198 g) per week after age 4 days. °· Consistent daily weight gain by age 5 days, without weight loss after the age of 2 weeks. °After a feeding, your baby may spit up a small amount. This is common. °BREASTFEEDING FREQUENCY AND DURATION °Frequent feeding will help you make more milk and can prevent sore nipples and breast engorgement. Breastfeed when you feel the need to reduce the fullness of your breasts or when your baby shows signs of hunger. This is called "breastfeeding on demand." Avoid introducing a pacifier to your baby while you are working to establish breastfeeding (the first 4-6 weeks after your baby is born). After this time you may choose to use a pacifier. Research has shown that pacifier use during the first year of a baby's life decreases the risk of sudden infant death syndrome (SIDS). °Allow your baby to feed on each breast as long as he or she wants. Breastfeed until your baby is finished feeding. When your baby unlatches or falls asleep while feeding from  the first breast, offer the second breast. Because newborns are often sleepy in the first few weeks of life, you may need to awaken your baby to get him or her to feed. °Breastfeeding times will vary from baby to baby. However, the following rules can serve as a guide to help you ensure that your baby is properly fed: °· Newborns (babies 4 weeks of age or younger) may breastfeed every 1-3 hours. °· Newborns should not go longer than 3 hours during the day or 5 hours during the night without breastfeeding. °· You should breastfeed your baby a minimum of 8 times in a 24-hour period until you begin to introduce solid foods to your baby at around 6 months of age. °BREAST MILK PUMPING °Pumping and storing breast milk allows you to ensure that your baby is exclusively fed your breast milk, even at times when you are unable to breastfeed. This is especially important if you are going back to work while you are still breastfeeding or when you are not able to be present during feedings. Your lactation consultant can give you guidelines on how long it is safe to store breast milk.  °A breast pump is a machine that allows you to pump milk from your breast into a sterile bottle. The pumped breast milk can then be stored in a refrigerator or freezer. Some breast pumps are operated by hand, while others use electricity. Ask your lactation consultant which type will work best for you. Breast pumps can be purchased, but some hospitals and breastfeeding support groups lease breast pumps on a monthly basis. A lactation consultant can teach you how to hand express breast milk, if you prefer not to use a pump.  °CARING FOR YOUR BREASTS WHILE YOU BREASTFEED °Nipples can become dry, cracked, and sore while breastfeeding. The following recommendations can help keep your breasts moisturized and healthy: °· Avoid using soap on your nipples.   °· Wear a supportive bra. Although not required, special nursing bras and tank tops are designed to  allow access to your breasts for   breastfeeding without taking off your entire bra or top. Avoid wearing underwire-style bras or extremely tight bras. °· Air dry your nipples for 3-4 minutes after each feeding.   °· Use only cotton bra pads to absorb leaked breast milk. Leaking of breast milk between feedings is normal.   °· Use lanolin on your nipples after breastfeeding. Lanolin helps to maintain your skin's normal moisture barrier. If you use pure lanolin, you do not need to wash it off before feeding your baby again. Pure lanolin is not toxic to your baby. You may also hand express a few drops of breast milk and gently massage that milk into your nipples and allow the milk to air dry. °In the first few weeks after giving birth, some women experience extremely full breasts (engorgement). Engorgement can make your breasts feel heavy, warm, and tender to the touch. Engorgement peaks within 3-5 days after you give birth. The following recommendations can help ease engorgement: °· Completely empty your breasts while breastfeeding or pumping. You may want to start by applying warm, moist heat (in the shower or with warm water-soaked hand towels) just before feeding or pumping. This increases circulation and helps the milk flow. If your baby does not completely empty your breasts while breastfeeding, pump any extra milk after he or she is finished. °· Wear a snug bra (nursing or regular) or tank top for 1-2 days to signal your body to slightly decrease milk production. °· Apply ice packs to your breasts, unless this is too uncomfortable for you. °· Make sure that your baby is latched on and positioned properly while breastfeeding. °If engorgement persists after 48 hours of following these recommendations, contact your health care provider or a lactation consultant. °OVERALL HEALTH CARE RECOMMENDATIONS WHILE BREASTFEEDING °· Eat healthy foods. Alternate between meals and snacks, eating 3 of each per day. Because what you  eat affects your breast milk, some of the foods may make your baby more irritable than usual. Avoid eating these foods if you are sure that they are negatively affecting your baby. °· Drink milk, fruit juice, and water to satisfy your thirst (about 10 glasses a day).   °· Rest often, relax, and continue to take your prenatal vitamins to prevent fatigue, stress, and anemia. °· Continue breast self-awareness checks. °· Avoid chewing and smoking tobacco. °· Avoid alcohol and drug use. °Some medicines that may be harmful to your baby can pass through breast milk. It is important to ask your health care provider before taking any medicine, including all over-the-counter and prescription medicine as well as vitamin and herbal supplements. °It is possible to become pregnant while breastfeeding. If birth control is desired, ask your health care provider about options that will be safe for your baby. °SEEK MEDICAL CARE IF:  °· You feel like you want to stop breastfeeding or have become frustrated with breastfeeding. °· You have painful breasts or nipples. °· Your nipples are cracked or bleeding. °· Your breasts are red, tender, or warm. °· You have a swollen area on either breast. °· You have a fever or chills. °· You have nausea or vomiting. °· You have drainage other than breast milk from your nipples. °· Your breasts do not become full before feedings by the fifth day after you give birth. °· You feel sad and depressed. °· Your baby is too sleepy to eat well. °· Your baby is having trouble sleeping.   °· Your baby is wetting less than 3 diapers in a 24-hour period. °· Your baby has   less than 3 stools in a 24-hour period. °· Your baby's skin or the white part of his or her eyes becomes yellow.   °· Your baby is not gaining weight by 5 days of age. °SEEK IMMEDIATE MEDICAL CARE IF:  °· Your baby is overly tired (lethargic) and does not want to wake up and feed. °· Your baby develops an unexplained fever. °Document Released:  09/27/2005 Document Revised: 10/02/2013 Document Reviewed: 03/21/2013 °[employer]® Patient Information ©2015 [employer]. This information is not intended to replace advice given to you by your health care provider. Make sure you discuss any questions you have with your health care provider. ° °

## 2014-09-30 ENCOUNTER — Ambulatory Visit: Payer: Self-pay

## 2014-09-30 LAB — TYPE AND SCREEN
ABO/RH(D): AB POS
ANTIBODY SCREEN: POSITIVE
DAT, IgG: NEGATIVE
UNIT DIVISION: 0
Unit division: 0

## 2014-09-30 NOTE — Lactation Note (Signed)
This note was copied from the chart of Christine Oliver. Lactation Consultation Note     Follow up consult with this mom of a small baby, weighing 4 lbs 7.6 ounces on discharge today, and 37 4/7 weeks CGA. Mom is mostly pumping and bottle feeding. She has sore nipples - intact but red. I decreased mom to 24 flanges, and gave her comfort gels. Mom was also engorged and very full. I did some reverse massage with mom laying flat in be, away from her nipple, toward her armpits. This helped soften and began her milk flowing. Mom was able to pump 90 mls of  Milk, adding massage with puping. Mom had been using a 20 nipple shield, but I refitted her with a 24 shield, and reviewed application with mom. The baby fed 30 mls of EBM quickly. I advised them to slowly in crease amounts by 5 mls at a time, and allow her to take as much EBm as she wants. I loaned mom a DEP, and make her an o/p appointment with lactation for 12/28. I also gave mom comfort gels and instructed ehr in their use.   Patient Name: Christine Oliver ZOXWR'UToday's Date: 09/30/2014 Reason for consult: Follow-up assessment   Maternal Data    Feeding    LATCH Score/Interventions          Comfort (Breast/Nipple): Engorged, cracked, bleeding, large blisters, severe discomfort Problem noted: Engorgment Intervention(s): Ice;Hand expression;Reverse pressure;Cabbage leaves           Lactation Tools Discussed/Used Tools: Nipple Shields;Comfort gels Nipple shield size: 24 WIC Program: Yes [facility] Pump Review: Setup, frequency, and cleaning   Consult Status Consult Status: Follow-up Date: 10/07/14 Follow-up type: Out-patient    Alfred LevinsLee, Debbora Ang Anne 09/30/2014, 10:53 AM

## 2014-09-30 NOTE — Progress Notes (Signed)
Post discharge chart review completed.  

## 2014-10-01 ENCOUNTER — Other Ambulatory Visit (HOSPITAL_COMMUNITY): Payer: Medicaid Other

## 2014-10-23 ENCOUNTER — Telehealth: Payer: Self-pay | Admitting: *Deleted

## 2014-10-23 ENCOUNTER — Encounter: Payer: Self-pay | Admitting: *Deleted

## 2014-10-23 ENCOUNTER — Ambulatory Visit: Payer: Medicaid Other | Admitting: Family Medicine

## 2014-10-23 NOTE — Telephone Encounter (Signed)
Christine Oliver missed a scheduled appointment for a postpartum visit today. Called and left a message notifying her she had missed an appointment today- please call back to reschedule. Will also send letter.

## 2014-11-09 IMAGING — US US OB COMP +14 WK
2 series · 12 of 28 positions shown · non-contrast
Comparison: none

[Series 1: us ob detail +14 wk · 76 acquisitions, 10 frames shown (1 of 2)]
[im 4/76]
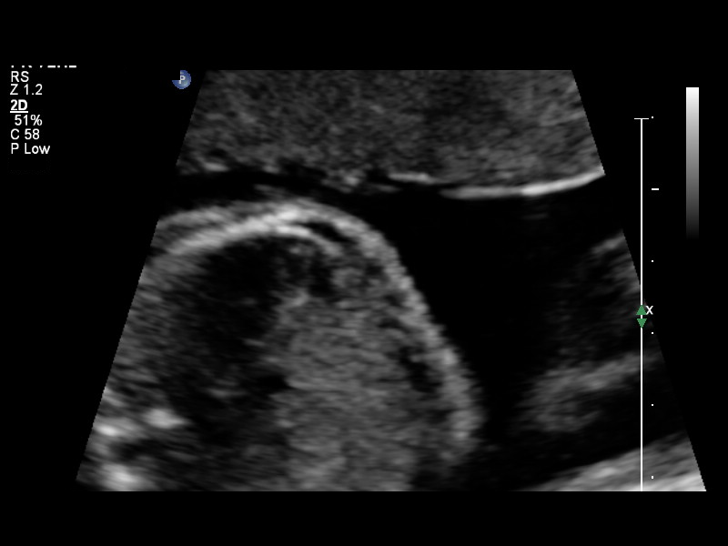
[im 10/76]
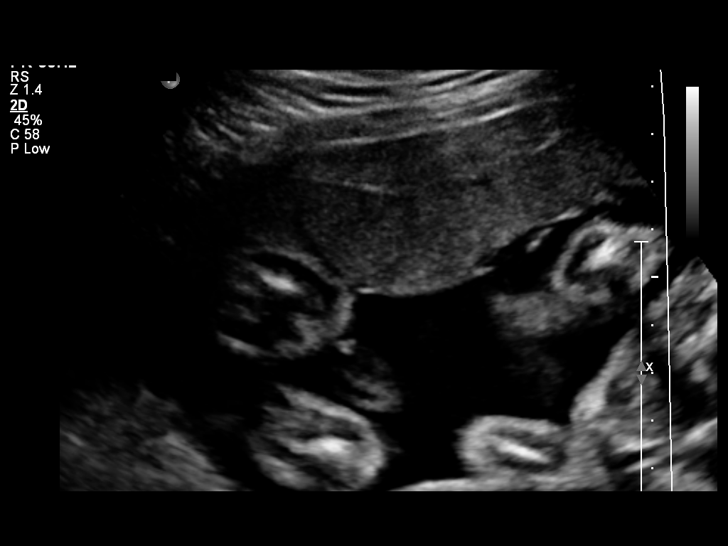
[im 17/76]
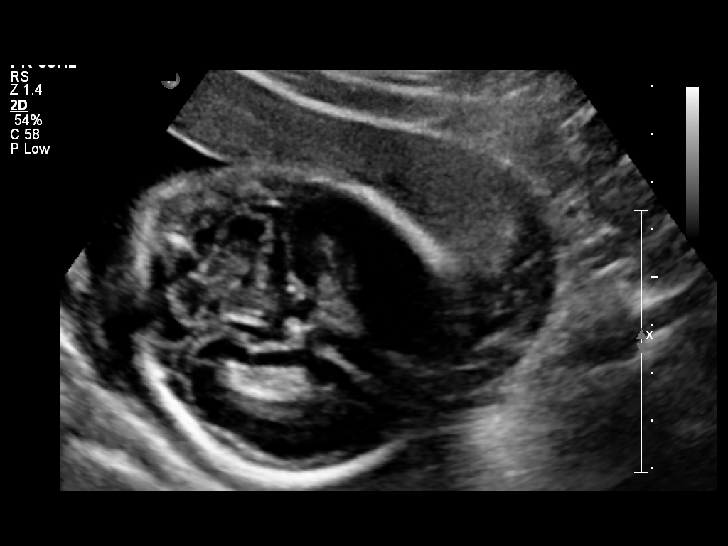
[im 27/76]
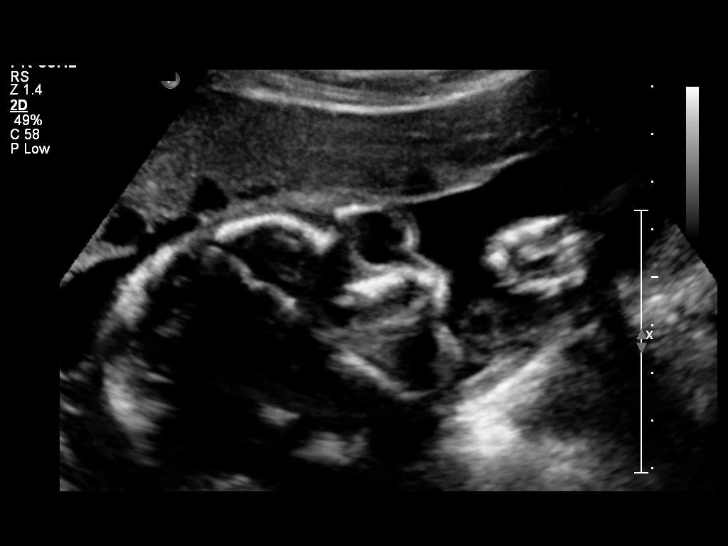
[im 33/76]
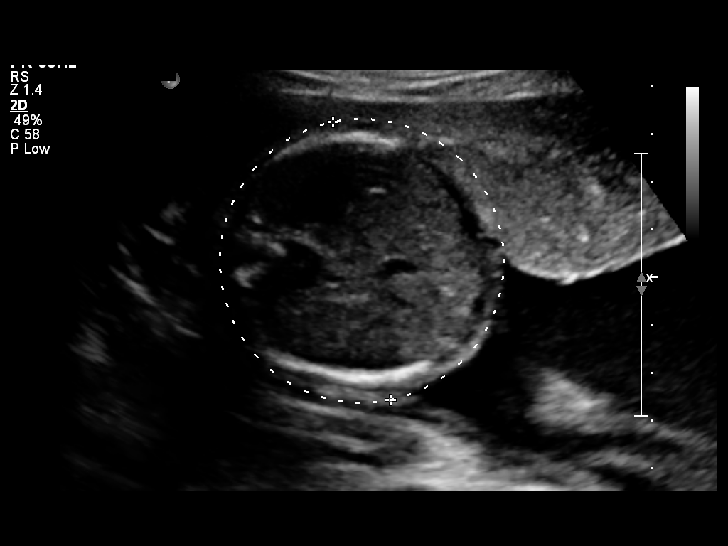
[im 40/76]
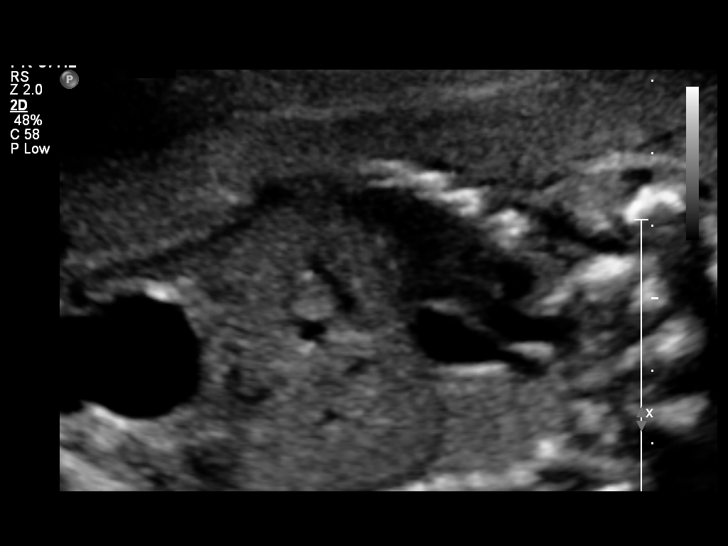
[im 49/76]
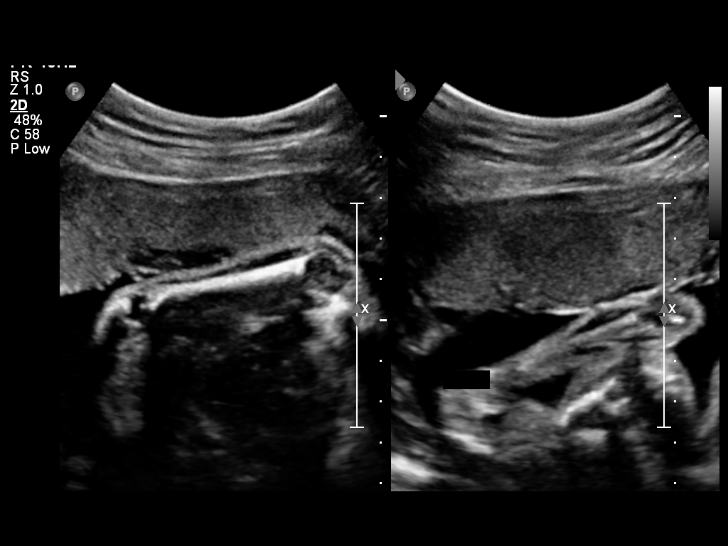
[im 56/76]
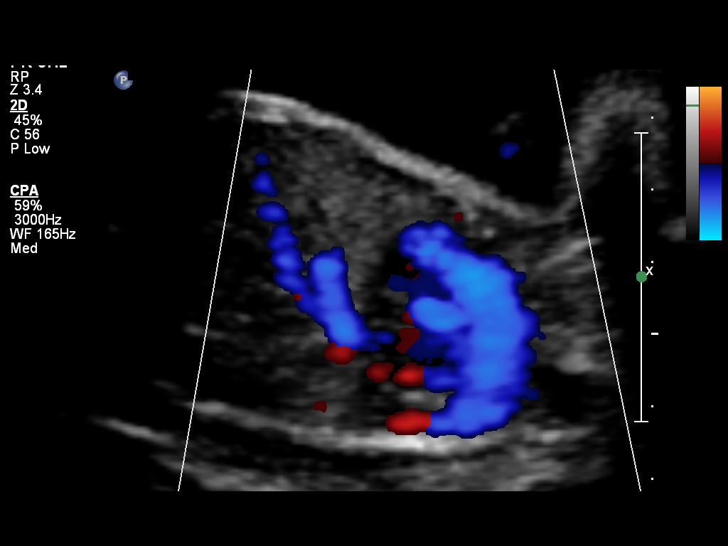
[im 62/76]
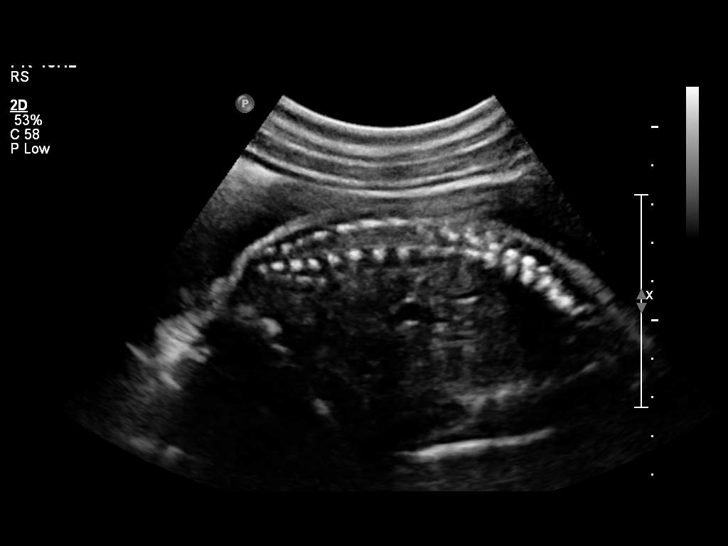
[im 72/76]
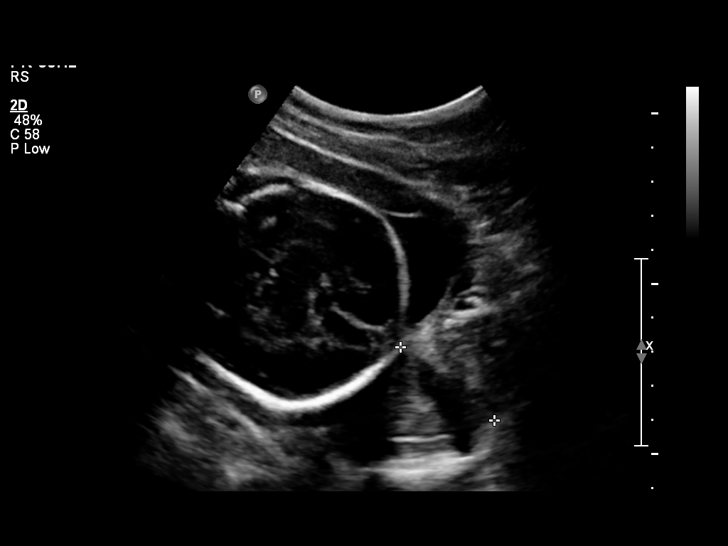

[Series 1: us ob detail +14 wk · 2 of 11 slices shown (2 of 2)]
[im 1/11]
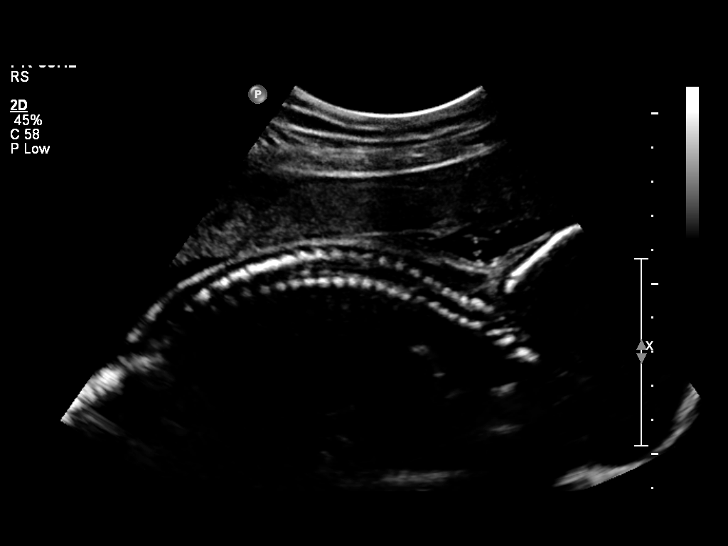
[im 7/11]
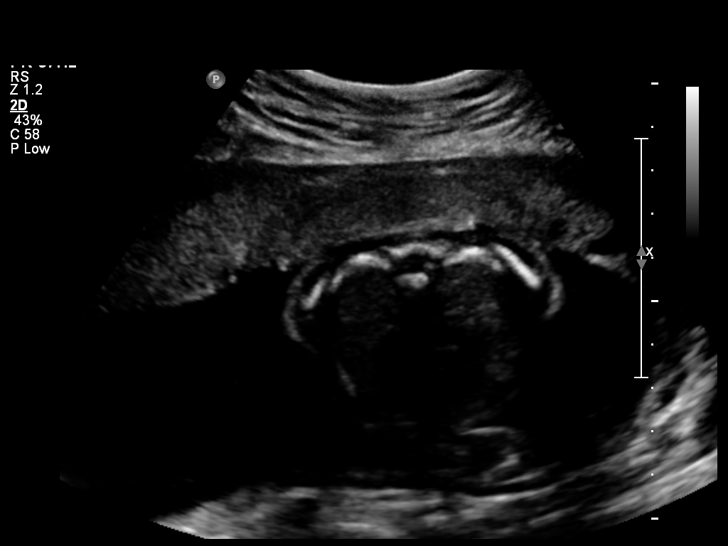

[12 of 28 positions shown; findings below may reference images not displayed]

OBSTETRICS REPORT
                      (Signed Final 07/04/2014 [DATE])

Service(s) Provided

 US OB COMP + 14 WK                                    76805.1
Indications

 No or Little Prenatal Care
 Basic anatomic survey
Fetal Evaluation

 Num Of Fetuses:    1
 Fetal Heart Rate:  158                          bpm
 Cardiac Activity:  Observed
 Presentation:      Cephalic
 Placenta:          Anterior, above cervical os
 P. Cord            Visualized, central
 Insertion:

 Amniotic Fluid
 AFI FV:      Subjectively within normal limits
                                             Larg Pckt:     4.0  cm
Biometry

 BPD:     62.8  mm     G. Age:  25w 3d                CI:         72.6   70 - 86
                                                      FL/HC:      18.9   18.7 -

 HC:     234.4  mm     G. Age:  25w 3d       46  %    HC/AC:      1.24   1.04 -

 AC:     188.4  mm     G. Age:  23w 4d        9  %    FL/BPD:     70.5   71 - 87
 FL:      44.3  mm     G. Age:  24w 4d       24  %    FL/AC:      23.5   20 - 24

 Est. FW:     675  gm      1 lb 8 oz     35  %
Gestational Age

 U/S Today:     24w 5d                                        EDD:   10/19/14
 Best:          25w 0d     Det. By:  U/S (06/15/14)           EDD:   10/17/14
Anatomy

 Cranium:          Appears normal         Aortic Arch:      Appears normal
 Fetal Cavum:      Appears normal         Ductal Arch:      Appears normal
 Ventricles:       Appears normal         Diaphragm:        Appears normal
 Choroid Plexus:   Appears normal         Stomach:          Appears normal, left
                                                            sided
 Cerebellum:       Appears normal         Abdomen:          Appears normal
 Posterior Fossa:  Appears normal         Abdominal Wall:   Not well visualized
 Nuchal Fold:      Appears normal         Cord Vessels:     Appears normal (3
                                                            vessel cord)
 Face:             Appears normal         Kidneys:          Appear normal
                   (orbits and profile)
 Lips:             Appears normal         Bladder:          Appears normal
 Heart:            Appears normal         Spine:            Appears normal
                   (4CH, axis, and
                   situs)
 RVOT:             Appears normal         Lower             Appears normal
                                          Extremities:
 LVOT:             Appears normal         Upper             Appears normal
                                          Extremities:

 Other:  Fetus appears to be a female. Heels visualized. Nasal bone
         visualized. Technically difficult due to fetal position.
Cervix Uterus Adnexa

 Cervical Length:    3.48     cm

 Cervix:       Normal appearance by transabdominal scan.
Impression

 Single IUP at 25w 0d
 Normal fetal anatomic survey
 The overall fetal growth is at the 35th %tile.  Of note, the AC
 measures at the 9th %tile.
 Anterior placenta
 Normal amniotic fluid volume
Recommendations

 Recommend follow-up ultrasound examination in 3 weeks for
 interval growth

 questions or concerns.
